# Patient Record
Sex: Male | Born: 1965
Health system: Southern US, Community
[De-identification: ages and names within clinical notes are randomized; demographics above are authoritative.]

## PROBLEM LIST (undated history)

## (undated) DIAGNOSIS — I10 Essential (primary) hypertension: Secondary | ICD-10-CM

---

## 1998-01-18 ENCOUNTER — Emergency Department (HOSPITAL_COMMUNITY): Admission: EM | Admit: 1998-01-18 | Discharge: 1998-01-18 | Payer: Self-pay | Admitting: Emergency Medicine

## 2002-11-07 ENCOUNTER — Emergency Department (HOSPITAL_COMMUNITY): Admission: EM | Admit: 2002-11-07 | Discharge: 2002-11-07 | Payer: Self-pay | Admitting: Emergency Medicine

## 2002-11-07 ENCOUNTER — Encounter: Payer: Self-pay | Admitting: Emergency Medicine

## 2003-08-30 ENCOUNTER — Emergency Department (HOSPITAL_COMMUNITY): Admission: EM | Admit: 2003-08-30 | Discharge: 2003-08-31 | Payer: Self-pay | Admitting: Emergency Medicine

## 2006-04-23 ENCOUNTER — Emergency Department (HOSPITAL_COMMUNITY): Admission: EM | Admit: 2006-04-23 | Discharge: 2006-04-23 | Payer: Self-pay | Admitting: Emergency Medicine

## 2006-10-15 ENCOUNTER — Emergency Department (HOSPITAL_COMMUNITY): Admission: EM | Admit: 2006-10-15 | Discharge: 2006-10-15 | Payer: Self-pay | Admitting: Family Medicine

## 2007-12-02 ENCOUNTER — Emergency Department (HOSPITAL_COMMUNITY): Admission: EM | Admit: 2007-12-02 | Discharge: 2007-12-02 | Payer: Self-pay | Admitting: Emergency Medicine

## 2008-10-27 ENCOUNTER — Emergency Department (HOSPITAL_COMMUNITY): Admission: EM | Admit: 2008-10-27 | Discharge: 2008-10-27 | Payer: Self-pay | Admitting: Family Medicine

## 2008-10-30 ENCOUNTER — Emergency Department (HOSPITAL_COMMUNITY): Admission: EM | Admit: 2008-10-30 | Discharge: 2008-10-30 | Payer: Self-pay | Admitting: Family Medicine

## 2008-11-06 ENCOUNTER — Emergency Department (HOSPITAL_COMMUNITY): Admission: EM | Admit: 2008-11-06 | Discharge: 2008-11-06 | Payer: Self-pay | Admitting: Emergency Medicine

## 2010-10-22 ENCOUNTER — Inpatient Hospital Stay (INDEPENDENT_AMBULATORY_CARE_PROVIDER_SITE_OTHER)
Admission: RE | Admit: 2010-10-22 | Discharge: 2010-10-22 | Disposition: A | Discharge status: Home / Self Care | Payer: Self-pay | Source: Ambulatory Visit | Attending: Family Medicine | Admitting: Family Medicine

## 2010-10-22 ENCOUNTER — Ambulatory Visit (INDEPENDENT_AMBULATORY_CARE_PROVIDER_SITE_OTHER): Payer: Self-pay

## 2010-10-22 DIAGNOSIS — M62838 Other muscle spasm: Secondary | ICD-10-CM

## 2010-10-22 DIAGNOSIS — M545 Low back pain: Secondary | ICD-10-CM

## 2010-10-28 ENCOUNTER — Inpatient Hospital Stay (INDEPENDENT_AMBULATORY_CARE_PROVIDER_SITE_OTHER)
Admission: RE | Admit: 2010-10-28 | Discharge: 2010-10-28 | Disposition: A | Discharge status: Home / Self Care | Payer: Self-pay | Source: Ambulatory Visit | Attending: Family Medicine | Admitting: Family Medicine

## 2010-10-28 DIAGNOSIS — S335XXA Sprain of ligaments of lumbar spine, initial encounter: Secondary | ICD-10-CM

## 2010-10-28 DIAGNOSIS — L02419 Cutaneous abscess of limb, unspecified: Secondary | ICD-10-CM

## 2010-10-28 DIAGNOSIS — L03119 Cellulitis of unspecified part of limb: Secondary | ICD-10-CM

## 2010-12-12 LAB — POCT URINALYSIS DIP (DEVICE)
Bilirubin Urine: NEGATIVE
Glucose, UA: NEGATIVE mg/dL
Hgb urine dipstick: NEGATIVE
Ketones, ur: NEGATIVE mg/dL
Nitrite: NEGATIVE
Protein, ur: 30 mg/dL — AB
Specific Gravity, Urine: 1.02 (ref 1.005–1.030)
Urobilinogen, UA: 0.2 mg/dL (ref 0.0–1.0)
pH: 7 (ref 5.0–8.0)

## 2010-12-17 LAB — POCT RAPID STREP A (OFFICE): Streptococcus, Group A Screen (Direct): NEGATIVE

## 2011-05-27 LAB — POCT URINALYSIS DIP (DEVICE)
Bilirubin Urine: NEGATIVE
Glucose, UA: NEGATIVE
Hgb urine dipstick: NEGATIVE
Ketones, ur: NEGATIVE
Nitrite: NEGATIVE
Operator id: 239701
Protein, ur: NEGATIVE
Specific Gravity, Urine: 1.025
Urobilinogen, UA: 1
pH: 5.5

## 2012-04-02 ENCOUNTER — Encounter (HOSPITAL_COMMUNITY): Payer: Self-pay

## 2012-04-02 ENCOUNTER — Emergency Department (HOSPITAL_COMMUNITY)
Admission: EM | Admit: 2012-04-02 | Discharge: 2012-04-03 | Disposition: A | Discharge status: Home Health | Payer: Self-pay | Attending: Emergency Medicine | Admitting: Emergency Medicine

## 2012-04-02 DIAGNOSIS — X500XXA Overexertion from strenuous movement or load, initial encounter: Secondary | ICD-10-CM | POA: Insufficient documentation

## 2012-04-02 DIAGNOSIS — S29011A Strain of muscle and tendon of front wall of thorax, initial encounter: Secondary | ICD-10-CM

## 2012-04-02 DIAGNOSIS — S23421A Sprain of chondrosternal joint, initial encounter: Secondary | ICD-10-CM | POA: Insufficient documentation

## 2012-04-02 NOTE — ED Notes (Signed)
Pt presents with no acute distress- chest pain central location non-radiating.  Pian is constant.  Increased pain with movement and breathing

## 2012-04-03 ENCOUNTER — Emergency Department (HOSPITAL_COMMUNITY): Payer: Self-pay

## 2012-04-03 LAB — BASIC METABOLIC PANEL
BUN: 19 mg/dL (ref 6–23)
CO2: 28 mEq/L (ref 19–32)
Calcium: 8.8 mg/dL (ref 8.4–10.5)
Chloride: 103 mEq/L (ref 96–112)
Creatinine, Ser: 0.99 mg/dL (ref 0.50–1.35)
GFR calc Af Amer: >90 mL/min (ref >90)
GFR calc non Af Amer: >90 mL/min (ref >90)
Glucose, Bld: 84 mg/dL (ref 70–99)
Potassium: 3.8 mEq/L (ref 3.5–5.1)
Sodium: 138 mEq/L (ref 135–145)

## 2012-04-03 LAB — CBC WITH DIFFERENTIAL/PLATELET
Basophils Absolute: 0 10*3/uL (ref 0.0–0.1)
Basophils Relative: 0 % (ref 0–1)
Eosinophils Absolute: 0.1 10*3/uL (ref 0.0–0.7)
Eosinophils Relative: 2 % (ref 0–5)
HCT: 37.8 % — ABNORMAL LOW (ref 39.0–52.0)
Hemoglobin: 12.7 g/dL — ABNORMAL LOW (ref 13.0–17.0)
Lymphocytes Relative: 31 % (ref 12–46)
Lymphs Abs: 1.9 10*3/uL (ref 0.7–4.0)
MCH: 30.6 pg (ref 26.0–34.0)
MCHC: 33.6 g/dL (ref 30.0–36.0)
MCV: 91.1 fL (ref 78.0–100.0)
Monocytes Absolute: 0.7 10*3/uL (ref 0.1–1.0)
Monocytes Relative: 12 % (ref 3–12)
Neutro Abs: 3.3 10*3/uL (ref 1.7–7.7)
Neutrophils Relative %: 55 % (ref 43–77)
Platelets: 221 10*3/uL (ref 150–400)
RBC: 4.15 MIL/uL — ABNORMAL LOW (ref 4.22–5.81)
RDW: 12.9 % (ref 11.5–15.5)
WBC: 6 10*3/uL (ref 4.0–10.5)

## 2012-04-03 MED ORDER — KETOROLAC TROMETHAMINE 30 MG/ML IJ SOLN
30.0000 mg | Freq: Once | INTRAMUSCULAR | Status: AC
  Administered 2012-04-03: 30 mg via INTRAVENOUS
  Filled 2012-04-03: qty 1

## 2012-04-03 MED ORDER — NAPROXEN 500 MG PO TABS: 500.0000 mg | ORAL_TABLET | Freq: Two times a day (BID) | ORAL | Status: DC

## 2012-04-03 MED ORDER — HYDROCODONE-ACETAMINOPHEN 5-325 MG PO TABS: 1.0000 | ORAL_TABLET | ORAL | Status: AC | PRN

## 2012-04-03 NOTE — Discharge Instructions (Signed)
Muscle Strain A muscle strain, or pulled muscle, occurs when a muscle is over-stretched. A small number of muscle fibers may also be torn. This is especially common in athletes. This happens when a sudden violent force placed on a muscle pushes it past its capacity. Usually, recovery from a pulled muscle takes 1 to 2 weeks. But complete healing will take 5 to 6 weeks. There are millions of muscle fibers. Following injury, your body will usually return to normal quickly. HOME CARE INSTRUCTIONS   While awake, apply ice to the sore muscle for 15 to 20 minutes each hour for the first 2 days. Put ice in a plastic bag and place a towel between the bag of ice and your skin.   Do not use the pulled muscle for several days. Do not use the muscle if you have pain.   You may wrap the injured area with an elastic bandage for comfort. Be careful not to bind it too tightly. This may interfere with blood circulation.   Only take over-the-counter or prescription medicines for pain, discomfort, or fever as directed by your caregiver. Do not use aspirin as this will increase bleeding (bruising) at injury site.   Warming up before exercise helps prevent muscle strains.  SEEK MEDICAL CARE IF:  There is increased pain or swelling in the affected area. MAKE SURE YOU:   Understand these instructions.   Will watch your condition.   Will get help right away if you are not doing well or get worse.  Document Released: 08/18/2005 Document Revised: 08/07/2011 Document Reviewed: 03/17/2007 [facility] Patient Information 2012 [place], [place].  Naproxen and naproxen sodium oral immediate-release tablets What is this medicine? NAPROXEN (na PROX en) is a non-steroidal anti-inflammatory drug (NSAID). It is used to reduce swelling and to treat pain. This medicine may be used for dental pain, headache, or painful monthly periods. It is also used for painful joint and muscular problems such as arthritis, tendinitis, bursitis,  and gout. This medicine may be used for other purposes; ask your health care provider or pharmacist if you have questions. What should I tell my health care provider before I take this medicine? They need to know if you have any of these conditions: -asthma -cigarette smoker -drink more than 3 alcohol containing drinks a day -heart disease or circulation problems such as heart failure or leg edema (fluid retention) -high blood pressure -kidney disease -liver disease -stomach bleeding or ulcers -an unusual or allergic reaction to naproxen, aspirin, other NSAIDs, other medicines, foods, dyes, or preservatives -pregnant or trying to get pregnant -breast-feeding How should I use this medicine? Take this medicine by mouth with a glass of water. Follow the directions on the prescription label. Take it with food if your stomach gets upset. Try to not lie down for at least 10 minutes after you take it. Take your medicine at regular intervals. Do not take your medicine more often than directed. Long-term, continuous use may increase the risk of heart attack or stroke. A special MedGuide will be given to you by the pharmacist with each prescription and refill. Be sure to read this information carefully each time. Talk to your pediatrician regarding the use of this medicine in children. Special care may be needed. Overdosage: If you think you have taken too much of this medicine contact a poison control center or emergency room at once. NOTE: This medicine is only for you. Do not share this medicine with others. What if I miss a dose? If  you miss a dose, take it as soon as you can. If it is almost time for your next dose, take only that dose. Do not take double or extra doses. What may interact with this medicine? -alcohol -aspirin -cidofovir -diuretics -lithium -methotrexate -other drugs for inflammation like ketorolac or prednisone -pemetrexed -probenecid -warfarin This list may not describe  all possible interactions. Give your health care provider a list of all the medicines, herbs, non-prescription drugs, or dietary supplements you use. Also tell them if you smoke, drink alcohol, or use illegal drugs. Some items may interact with your medicine. What should I watch for while using this medicine? Tell your doctor or health care professional if your pain does not get better. Talk to your doctor before taking another medicine for pain. Do not treat yourself. This medicine does not prevent heart attack or stroke. In fact, this medicine may increase the chance of a heart attack or stroke. The chance may increase with longer use of this medicine and in people who have heart disease. If you take aspirin to prevent heart attack or stroke, talk with your doctor or health care professional. Do not take other medicines that contain aspirin, ibuprofen, or naproxen with this medicine. Side effects such as stomach upset, nausea, or ulcers may be more likely to occur. Many medicines available without a prescription should not be taken with this medicine. This medicine can cause ulcers and bleeding in the stomach and intestines at any time during treatment. Do not smoke cigarettes or drink alcohol. These increase irritation to your stomach and can make it more susceptible to damage from this medicine. Ulcers and bleeding can happen without warning symptoms and can cause death. You may get drowsy or dizzy. Do not drive, use machinery, or do anything that needs mental alertness until you know how this medicine affects you. Do not stand or sit up quickly, especially if you are an older patient. This reduces the risk of dizzy or fainting spells. This medicine can cause you to bleed more easily. Try to avoid damage to your teeth and gums when you brush or floss your teeth. What side effects may I notice from receiving this medicine? Side effects that you should report to your doctor or health care professional as  soon as possible: -black or bloody stools, blood in the urine or vomit -blurred vision -chest pain -difficulty breathing or wheezing -nausea or vomiting -severe stomach pain -skin rash, skin redness, blistering or peeling skin, hives, or itching -slurred speech or weakness on one side of the body -swelling of eyelids, throat, lips -unexplained weight gain or swelling -unusually weak or tired -yellowing of eyes or skin Side effects that usually do not require medical attention (report to your doctor or health care professional if they continue or are bothersome): -constipation -headache -heartburn This list may not describe all possible side effects. Call your doctor for medical advice about side effects. You may report side effects to FDA at 1-800-FDA-1088. Where should I keep my medicine? Keep out of the reach of children. Store at room temperature between 15 and 30 degrees C (59 and 86 degrees F). Keep container tightly closed. Throw away any unused medicine after the expiration date. NOTE: This sheet is a summary. It may not cover all possible information. If you have questions about this medicine, talk to your doctor, pharmacist, or health care provider.  2012, [employer] Standard. (08/20/2009 8:10:16 PM)  Acetaminophen; Hydrocodone tablets or capsules What is this medicine? ACETAMINOPHEN; HYDROCODONE (a  set a MEE noe fen; hye droe KOE done) is a pain reliever. It is used to treat mild to moderate pain. This medicine may be used for other purposes; ask your health care provider or pharmacist if you have questions. What should I tell my health care provider before I take this medicine? They need to know if you have any of these conditions: -brain tumor -Crohn's disease, inflammatory bowel disease, or ulcerative colitis -drink more than 3 alcohol-containing drinks per day -drug abuse or addiction -head injury -heart or circulation problems -kidney disease or problems going  to the bathroom -liver disease -lung disease, asthma, or breathing problems -an unusual or allergic reaction to acetaminophen, hydrocodone, other opioid analgesics, other medicines, foods, dyes, or preservatives -pregnant or trying to get pregnant -breast-feeding How should I use this medicine? Take this medicine by mouth. Swallow it with a full glass of water. Follow the directions on the prescription label. If the medicine upsets your stomach, take the medicine with food or milk. Do not take more than you are told to take. Talk to your pediatrician regarding the use of this medicine in children. This medicine is not approved for use in children. Overdosage: If you think you have taken too much of this medicine contact a poison control center or emergency room at once. NOTE: This medicine is only for you. Do not share this medicine with others. What if I miss a dose? If you miss a dose, take it as soon as you can. If it is almost time for your next dose, take only that dose. Do not take double or extra doses. What may interact with this medicine? -alcohol or medicines that contain alcohol -antihistamines -isoniazid -medicines for depression, anxiety, or psychotic disturbances -medicines for pain including pentazocine, buprenorphine, butorphanol, nalbuphine, tramadol, and propoxyphene -medicines for sleep -muscle relaxants -naltrexone -phenobarbital -ritonavir This list may not describe all possible interactions. Give your health care provider a list of all the medicines, herbs, non-prescription drugs, or dietary supplements you use. Also tell them if you smoke, drink alcohol, or use illegal drugs. Some items may interact with your medicine. What should I watch for while using this medicine? Tell your doctor or health care professional if your pain does not go away, if it gets worse, or if you have new or a different type of pain. You may develop tolerance to the medicine. Tolerance means  that you will need a higher dose of the medicine for pain relief. Tolerance is normal and is expected if you take the medicine for a long time. Do not suddenly stop taking your medicine because you may develop a severe reaction. Your body becomes used to the medicine. This does NOT mean you are addicted. Addiction is a behavior related to getting and using a drug for a non-medical reason. If you have pain, you have a medical reason to take pain medicine. Your doctor will tell you how much medicine to take. If your doctor wants you to stop the medicine, the dose will be slowly lowered over time to avoid any side effects. You may get drowsy or dizzy when you first start taking the medicine or change doses. Do not drive, use machinery, or do anything that may be dangerous until you know how the medicine affects you. Stand or sit up slowly. The medicine will cause constipation. Try to have a bowel movement at least every 2 to 3 days. If you do not have a bowel movement for 3 days, call your  doctor or health care professional. Too much acetaminophen can be very dangerous. Do not take Tylenol (acetaminophen) or medicines that contain acetaminophen with this medicine. Many non-prescription medicines contain acetaminophen. Always read the labels carefully. What side effects may I notice from receiving this medicine? Side effects that you should report to your doctor or health care professional as soon as possible: -allergic reactions like skin rash, itching or hives, swelling of the face, lips, or tongue -breathing problems -confusion -feeling faint or lightheaded, falls -stomach pain -yellowing of the eyes or skin Side effects that usually do not require medical attention (report to your doctor or health care professional if they continue or are bothersome): -nausea, vomiting -stomach upset This list may not describe all possible side effects. Call your doctor for medical advice about side effects. You may  report side effects to FDA at 1-800-FDA-1088. Where should I keep my medicine? Keep out of the reach of children. This medicine can be abused. Keep your medicine in a safe place to protect it from theft. Do not share this medicine with anyone. Selling or giving away this medicine is dangerous and against the law. Store at room temperature between 15 and 30 degrees C (59 and 86 degrees F). Protect from light. Keep container tightly closed. Throw away any unused medicine after the expiration date. NOTE: This sheet is a summary. It may not cover all possible information. If you have questions about this medicine, talk to your doctor, pharmacist, or health care provider.  2012, [employer] Standard. (11/09/2007 10:25:07 AM)

## 2012-04-03 NOTE — ED Provider Notes (Signed)
History     CSN: [id number]  Arrival date & time 04/02/12  2336   First MD Initiated Contact with Patient 04/03/12 0304      Chief Complaint  Patient presents with  . Chest Pain    (Consider location/radiation/quality/duration/timing/severity/associated sxs/prior treatment) Patient is a 46 y.o. male presenting with chest pain. The history is provided by the patient.  Chest Pain   He was lifting some limbs with a pitchfork yesterday when he felt some pain in his left anterior chest. Since then, he has had sharp pain in his left anterior chest. It is worse with taking deep breaths and worse with moving. It is better when he sits still. There's been associated dyspnea, nausea, diaphoresis. He has not done anything to try and treat it. Pain is moderate to severe and he rates it at 7/10. He has no cardiac risk factors-he is a nonsmoker and no history of diabetes, hypertension, hyperlipidemia, or family history of premature coronary atherosclerosis.  History reviewed. No pertinent past medical history.  History reviewed. No pertinent past surgical history.  No family history on file.  History  Substance Use Topics  . Smoking status: Never Smoker   . Smokeless tobacco: Not on file  . Alcohol Use: No      Review of Systems  Cardiovascular: Positive for chest pain.  All other systems reviewed and are negative.    Allergies  Review of patient's allergies indicates no known allergies.  Home Medications   Current Outpatient Rx  Name Route Sig Dispense Refill  . CLINDAMYCIN HCL 150 MG PO CAPS Oral Take 150 mg by mouth daily.    Marland Kitchen DOXYCYCLINE HYCLATE 100 MG PO CAPS Oral Take 100 mg by mouth 3 (three) times daily.      BP 132/89  Pulse 78  Temp 98.5 F (36.9 C) (Oral)  Resp 13  SpO2 100%  Physical Exam  Nursing note and vitals reviewed.  46 year old male who is resting comfortably and in no acute distress, although he is obviously in pain when he tries to sit up. Vital  signs are normal. Oxygen saturation is 99% which is normal. Head is normocephalic and atraumatic. PERRLA, EOMI. Oropharynx is clear. Neck is nontender and supple without adenopathy or JVD. Lungs are clear without rales, wheezes, rhonchi. Back is nontender. Heart has regular rate and rhythm without murmur. There is moderate tenderness to palpation of the left anterior chest wall which does reproduce his pain. Abdomen is soft, flat, nontender without masses or hepatosplenomegaly. Extremities have no cyanosis or edema, full range of motion is present. Skin is warm and dry without rash. Neurologic: Mental status is normal, cranial nerves are intact, there are no motor or sensory deficits.   ED Course  Procedures (including critical care time)  Results for orders placed during the hospital encounter of 04/02/12  CBC WITH DIFFERENTIAL      Component Value Range   WBC 6.0  4.0 - 10.5 K/uL   RBC 4.15 (*) 4.22 - 5.81 MIL/uL   Hemoglobin 12.7 (*) 13.0 - 17.0 g/dL   HCT 40.9 (*) 81.1 - 91.4 %   MCV 91.1  78.0 - 100.0 fL   MCH 30.6  26.0 - 34.0 pg   MCHC 33.6  30.0 - 36.0 g/dL   RDW 78.2  95.6 - 21.3 %   Platelets 221  150 - 400 K/uL   Neutrophils Relative 55  43 - 77 %   Neutro Abs 3.3  1.7 - 7.7 K/uL  Lymphocytes Relative 31  12 - 46 %   Lymphs Abs 1.9  0.7 - 4.0 K/uL   Monocytes Relative 12  3 - 12 %   Monocytes Absolute 0.7  0.1 - 1.0 K/uL   Eosinophils Relative 2  0 - 5 %   Eosinophils Absolute 0.1  0.0 - 0.7 K/uL   Basophils Relative 0  0 - 1 %   Basophils Absolute 0.0  0.0 - 0.1 K/uL  BASIC METABOLIC PANEL      Component Value Range   Sodium 138  135 - 145 mEq/L   Potassium 3.8  3.5 - 5.1 mEq/L   Chloride 103  96 - 112 mEq/L   CO2 28  19 - 32 mEq/L   Glucose, Bld 84  70 - 99 mg/dL   BUN 19  6 - 23 mg/dL   Creatinine, Ser 4.78  0.50 - 1.35 mg/dL   Calcium 8.8  8.4 - 29.5 mg/dL   GFR calc non Af Amer >90  >90 mL/min   GFR calc Af Amer >90  >90 mL/min   Dg Chest 2 View  04/03/2012   *RADIOLOGY REPORT*  Clinical Data: Chest pain  CHEST - 2 VIEW  Comparison: None.  Findings: Shallow inspiration.  Suggestion of atelectasis in the lung bases.  Normal heart size and pulmonary vascularity.  No focal airspace consolidation.  No blunting of costophrenic angles.  No pneumothorax.  Degenerative changes in the spine.  IMPRESSION: Shallow inspiration with mild atelectasis in the lung bases.  Original Report Authenticated By: Marlon Pel, M.D.     ECG shows normal sinus rhythm with a rate of 91, no ectopy. Normal axis. Normal P wave. Normal QRS. Normal intervals. Normal ST and T waves. Impression: normal ECG. No prior ECG available for comparison.   1. Chest wall muscle strain       MDM  Chest pain which seems clearly to be chest wall pain. His ECG is normal and he has no cardiac risk factors and there is a clear injury that precipitated the pain. Chest x-ray will be obtained and will be given a therapeutic trial of Toradol  He feels much better after a dose of Toradol. He is sent home with prescriptions for naproxen and Norco.      Dione Booze, MD 04/03/12 [id number]

## 2012-04-27 ENCOUNTER — Encounter (HOSPITAL_COMMUNITY): Payer: Self-pay | Admitting: *Deleted

## 2012-04-27 ENCOUNTER — Emergency Department (HOSPITAL_COMMUNITY): Payer: Self-pay

## 2012-04-27 ENCOUNTER — Emergency Department (HOSPITAL_COMMUNITY)
Admission: EM | Admit: 2012-04-27 | Discharge: 2012-04-27 | Disposition: A | Discharge status: Home / Self Care | Payer: Self-pay | Attending: Emergency Medicine | Admitting: Emergency Medicine

## 2012-04-27 DIAGNOSIS — R609 Edema, unspecified: Secondary | ICD-10-CM | POA: Insufficient documentation

## 2012-04-27 DIAGNOSIS — W11XXXA Fall on and from ladder, initial encounter: Secondary | ICD-10-CM | POA: Insufficient documentation

## 2012-04-27 DIAGNOSIS — M25572 Pain in left ankle and joints of left foot: Secondary | ICD-10-CM

## 2012-04-27 DIAGNOSIS — Z87891 Personal history of nicotine dependence: Secondary | ICD-10-CM | POA: Insufficient documentation

## 2012-04-27 DIAGNOSIS — M25473 Effusion, unspecified ankle: Secondary | ICD-10-CM

## 2012-04-27 DIAGNOSIS — M25579 Pain in unspecified ankle and joints of unspecified foot: Secondary | ICD-10-CM | POA: Insufficient documentation

## 2012-04-27 HISTORY — DX: Essential (primary) hypertension: I10

## 2012-04-27 MED ORDER — IBUPROFEN 800 MG PO TABS
800.0000 mg | ORAL_TABLET | Freq: Once | ORAL | Status: AC
  Administered 2012-04-27: 800 mg via ORAL
  Filled 2012-04-27: qty 1

## 2012-04-27 MED ORDER — MELOXICAM 15 MG PO TABS: 15.0000 mg | ORAL_TABLET | Freq: Every day | ORAL | Status: DC

## 2012-04-27 NOTE — ED Notes (Signed)
Pt cutting tree down, was up a ladder. Jumped off from 4-5 feet. C/o left ankle pain. Denies LOC

## 2012-04-27 NOTE — ED Notes (Signed)
Pt reports essentially he jumped from approx 4-5 feet off ladder & landed on left foot. Denies LOC, other injuries.

## 2012-04-27 NOTE — Progress Notes (Signed)
Orthopedic Tech Progress Note Patient Details:  Scott Yang 1966-07-08 [id number]  Ortho Devices Type of Ortho Device: ASO;Crutches Ortho Device/Splint Location: left ankle Ortho Device/Splint Interventions: Application   Lanette Ell 04/27/2012, 2:49 PM

## 2012-05-02 ENCOUNTER — Emergency Department (HOSPITAL_COMMUNITY)
Admission: EM | Admit: 2012-05-02 | Discharge: 2012-05-02 | Disposition: A | Discharge status: Home Health | Payer: Self-pay | Attending: Emergency Medicine | Admitting: Emergency Medicine

## 2012-05-02 ENCOUNTER — Encounter (HOSPITAL_COMMUNITY): Payer: Self-pay | Admitting: *Deleted

## 2012-05-02 ENCOUNTER — Emergency Department (HOSPITAL_COMMUNITY): Payer: Self-pay

## 2012-05-02 DIAGNOSIS — S93409A Sprain of unspecified ligament of unspecified ankle, initial encounter: Secondary | ICD-10-CM | POA: Insufficient documentation

## 2012-05-02 DIAGNOSIS — W11XXXA Fall on and from ladder, initial encounter: Secondary | ICD-10-CM | POA: Insufficient documentation

## 2012-05-02 DIAGNOSIS — R062 Wheezing: Secondary | ICD-10-CM | POA: Insufficient documentation

## 2012-05-02 DIAGNOSIS — M7989 Other specified soft tissue disorders: Secondary | ICD-10-CM

## 2012-05-02 DIAGNOSIS — M79609 Pain in unspecified limb: Secondary | ICD-10-CM

## 2012-05-02 MED ORDER — ALBUTEROL SULFATE HFA 108 (90 BASE) MCG/ACT IN AERS: 2.0000 | INHALATION_SPRAY | RESPIRATORY_TRACT | Status: DC | PRN

## 2012-05-02 MED ORDER — ALBUTEROL SULFATE HFA 108 (90 BASE) MCG/ACT IN AERS
INHALATION_SPRAY | RESPIRATORY_TRACT | Status: AC
  Filled 2012-05-02: qty 6.7

## 2012-05-02 NOTE — ED Notes (Signed)
Pt to xray via stretcher

## 2012-05-02 NOTE — ED Provider Notes (Addendum)
History     CSN: [id number]  Arrival date & time 05/02/12  0932   First MD Initiated Contact with Patient 05/02/12 1058      Chief Complaint  Patient presents with  . Ankle Pain  . Joint Swelling    (Consider location/radiation/quality/duration/timing/severity/associated sxs/prior treatment) Patient is a 46 y.o. male presenting with ankle pain.  Ankle Pain    Patient fell from a ladder last week injuring his left ankle. He presents today as he has increased swelling in his left ankle and left lower leg. Pain is minimal he has minimal pain at the proximal dorsal foot upon plantar flexing or dorsiflexing his left ankle. No other complaint . He reports he has not been affect quickly elevating his leg. He's been wearing the Velcro splint intermittently. No other treatment prior to coming here no chest pain no other associated symptoms History reviewed. No pertinent past medical history. Past history negative History reviewed. No pertinent past surgical history.  History reviewed. No pertinent family history.  History  Substance Use Topics  . Smoking status: Former Games developer  . Smokeless tobacco: Not on file  . Alcohol Use: No      Review of Systems  Constitutional: Negative.   HENT: Negative.   Respiratory: Positive for wheezing.   Cardiovascular: Negative.   Gastrointestinal: Negative.   Musculoskeletal: Positive for arthralgias.       The left lower leg pain and swelling  Skin: Negative.   Neurological: Negative.   Hematological: Negative.   Psychiatric/Behavioral: Negative.     Allergies  Review of patient's allergies indicates no known allergies.  Home Medications   Current Outpatient Rx  Name Route Sig Dispense Refill  . IBUPROFEN 200 MG PO TABS Oral Take 600 mg by mouth every 6 (six) hours as needed. For pain    . PRESCRIPTION MEDICATION Injection Inject as directed every 14 (fourteen) days. Testosterone Inj    . MELOXICAM 15 MG PO TABS Oral Take 15 mg by mouth  daily.      BP 158/102  Pulse 79  Temp 97.9 F (36.6 C) (Oral)  Resp 18  SpO2 96%  Physical Exam  Nursing note and vitals reviewed. Constitutional: He appears well-developed and well-nourished.  HENT:  Head: Normocephalic and atraumatic.  Eyes: Conjunctivae are normal. Pupils are equal, round, and reactive to light.  Neck: Neck supple. No tracheal deviation present. No thyromegaly present.  Cardiovascular: Normal rate and regular rhythm.   No murmur heard. Pulmonary/Chest: Effort normal and breath sounds normal.  Abdominal: Soft. Bowel sounds are normal. He exhibits no distension. There is no tenderness.  Musculoskeletal: He exhibits edema. He exhibits no tenderness.       Left lower extremity swollen past lower leg, distal two thirds. Dorsum of the foot is swollen. Ecchymotic lateral malleolus. DP pulse 2+, no point tenderness, no cords  Neurological: He is alert. Coordination normal.  Skin: Skin is warm and dry. No rash noted.  Psychiatric: He has a normal mood and affect.    ED Course  Procedures (including critical care time)  Labs Reviewed - No data to display No results found.   No diagnosis found.  12:30 PM reports to me that "I've been wheezing on occasion lately" particularly when he is outside and around grass. Denies shortness of breath presently. Chest x-ray ordered. 2:05 PM patient resting comfortably.  no distress,, breathing normal. Chest x-ray reviewed by me and discussed with radiologist Doppler study of left leg negative for DVT. Results for orders  placed during the hospital encounter of 04/02/12  CBC WITH DIFFERENTIAL      Component Value Range   WBC 6.0  4.0 - 10.5 K/uL   RBC 4.15 (*) 4.22 - 5.81 MIL/uL   Hemoglobin 12.7 (*) 13.0 - 17.0 g/dL   HCT 40.9 (*) 81.1 - 91.4 %   MCV 91.1  78.0 - 100.0 fL   MCH 30.6  26.0 - 34.0 pg   MCHC 33.6  30.0 - 36.0 g/dL   RDW 78.2  95.6 - 21.3 %   Platelets 221  150 - 400 K/uL   Neutrophils Relative 55  43 - 77  %   Neutro Abs 3.3  1.7 - 7.7 K/uL   Lymphocytes Relative 31  12 - 46 %   Lymphs Abs 1.9  0.7 - 4.0 K/uL   Monocytes Relative 12  3 - 12 %   Monocytes Absolute 0.7  0.1 - 1.0 K/uL   Eosinophils Relative 2  0 - 5 %   Eosinophils Absolute 0.1  0.0 - 0.7 K/uL   Basophils Relative 0  0 - 1 %   Basophils Absolute 0.0  0.0 - 0.1 K/uL  BASIC METABOLIC PANEL      Component Value Range   Sodium 138  135 - 145 mEq/L   Potassium 3.8  3.5 - 5.1 mEq/L   Chloride 103  96 - 112 mEq/L   CO2 28  19 - 32 mEq/L   Glucose, Bld 84  70 - 99 mg/dL   BUN 19  6 - 23 mg/dL   Creatinine, Ser 0.86  0.50 - 1.35 mg/dL   Calcium 8.8  8.4 - 57.8 mg/dL   GFR calc non Af Amer >90  >90 mL/min   GFR calc Af Amer >90  >90 mL/min   Dg Chest 2 View  05/02/2012  *RADIOLOGY REPORT*  Clinical Data: Wheezing  CHEST - 2 VIEW  Comparison: 04/03/2012  Findings: Mild cardiomegaly stable.  Lungs clear.  No effusion. Spurring in the lower thoracic spine.  IMPRESSION:  1.  Stable mild cardiomegaly.   Original Report Authenticated By: Osa Craver, M.D.    Dg Chest 2 View  04/03/2012  *RADIOLOGY REPORT*  Clinical Data: Chest pain  CHEST - 2 VIEW  Comparison: None.  Findings: Shallow inspiration.  Suggestion of atelectasis in the lung bases.  Normal heart size and pulmonary vascularity.  No focal airspace consolidation.  No blunting of costophrenic angles.  No pneumothorax.  Degenerative changes in the spine.  IMPRESSION: Shallow inspiration with mild atelectasis in the lung bases.  Original Report Authenticated By: Marlon Pel, M.D.   Dg Ankle Complete Left  04/27/2012  *RADIOLOGY REPORT*  Clinical Data: Fall, ankle pain  LEFT ANKLE COMPLETE - 3+ VIEW  Comparison: None.  Findings: No acute fracture or dislocation is seen.  The ankle mortise is intact.  The base of the fifth metatarsal is unremarkable.  Prior trauma involving the distal tibiofibular syndesmosis.  Moderate generalized soft tissue swelling.  IMPRESSION: No  acute fracture or dislocation is seen.  Moderate generalized soft tissue swelling.   Original Report Authenticated By: Charline Bills, M.D.     MDM  Plan patient given oral degrees per orthopedics as last visit here with whom he will followup. Albuterol HFA to go with spacer to use 2 puffs every 4 hours as needed for wheeze. Blood pressure recheck 3 weeks Diagnosis #1 left ankle sprain #2 elevated blood pressure #3 dyspnea  Doug Sou, MD 05/02/12 1411  Doug Sou, MD 05/02/12 [phone]

## 2012-05-02 NOTE — ED Notes (Signed)
Ultrasound at bedside

## 2012-05-02 NOTE — Progress Notes (Signed)
VASCULAR LAB PRELIMINARY  PRELIMINARY  PRELIMINARY  PRELIMINARY  Left lower extremity venous Doppler completed.    Preliminary report:  There is no DVT or SVT noted in the left lower extremity.  Scott Yang, 05/02/2012, 11:59 AM

## 2012-05-02 NOTE — ED Notes (Addendum)
Pt fell from ladder on the 27th hurting left ankle, xray was negative for break or fracture. Pt reports he is still having swelling to left ankle. Pt has crutches and ankle is in wrap. Pt reports cont pain to ankle. Pt has applying minimal pressure and not elevating. Pt has swelling going up to mid calf. Only reports pain to ankle however.

## 2012-05-03 NOTE — ED Provider Notes (Signed)
History     CSN: [id number]  Arrival date & time 04/27/12  1153   First MD Initiated Contact with Patient 04/27/12 1230      Chief Complaint  Patient presents with  . Fall  . Ankle Pain    (Consider location/radiation/quality/duration/timing/severity/associated sxs/prior treatment) HPI Comments: Scott Yang 46 y.o. male   The chief complaint is: Patient presents with:   Fall   Ankle Pain    Patient states he fell from ladder while cutting limbs.  States that the ladder sliped and he rod it down from a height of about 20 feet and jumped off with approx 5 feet to go.  He landed on left foot, flat footed .  Felt immediate pain in ankle. abjke is swelling  Unable to ambulate due to pain. Denies rolling ankle. Denies, numbnessor tingling. Denies inablity to move toes or ankle joint. Denies pain in Back or pelvis.  Denies hitting head or LOC.    Patient is a 46 y.o. male presenting with fall and ankle pain. The history is provided by the patient. No language interpreter was used.  Fall The accident occurred 1 to 2 hours ago. The fall occurred from a ladder. He fell from a height of 6 to 10 ft. He landed on grass. There was no blood loss. Point of impact: left ankle. Pain location: left ankle. The pain is at a severity of 8/10. The pain is moderate. He was not ambulatory at the scene. There was no entrapment after the fall. There was no alcohol use involved in the accident. Pertinent negatives include no visual change, no fever, no numbness, no abdominal pain, no bowel incontinence, no nausea, no vomiting, no hematuria, no headaches, no hearing loss, no loss of consciousness and no tingling. The symptoms are aggravated by activity, use of the injured limb, standing, ambulation, flexion, extension and rotation. He has tried nothing for the symptoms.  Ankle Pain  Pertinent negatives include no numbness and no tingling.    History reviewed. No pertinent past medical  history.  History reviewed. No pertinent past surgical history.  No family history on file.  History  Substance Use Topics  . Smoking status: Former Games developer  . Smokeless tobacco: Not on file  . Alcohol Use: No      Review of Systems  Constitutional: Negative for fever.  Gastrointestinal: Negative for nausea, vomiting, abdominal pain and bowel incontinence.  Genitourinary: Negative for hematuria.  Neurological: Negative for tingling, loss of consciousness, numbness and headaches.    Allergies  Review of patient's allergies indicates no known allergies.  Home Medications   Current Outpatient Rx  Name Route Sig Dispense Refill  . PRESCRIPTION MEDICATION Injection Inject as directed every 14 (fourteen) days. Testosterone Inj    . IBUPROFEN 200 MG PO TABS Oral Take 600 mg by mouth every 6 (six) hours as needed. For pain    . MELOXICAM 15 MG PO TABS Oral Take 15 mg by mouth daily.      BP 146/82  Pulse 91  Temp 98.5 F (36.9 C) (Oral)  Resp 20  SpO2 96%  Physical Exam  Nursing note and vitals reviewed. Constitutional: He appears well-developed and well-nourished. No distress.  HENT:  Head: Normocephalic and atraumatic.  Eyes: Conjunctivae are normal. No scleral icterus.  Neck: Normal range of motion. Neck supple.  Cardiovascular: Normal rate, regular rhythm and normal heart sounds.   Pulmonary/Chest: Effort normal and breath sounds normal. No respiratory distress.  Abdominal: Soft. There is no  tenderness.  Musculoskeletal:       Left ankle: He exhibits decreased range of motion and swelling. He exhibits no ecchymosis, no deformity, no laceration and normal pulse. tenderness. Lateral malleolus and medial malleolus tenderness found. No AITFL, no CF ligament, no posterior TFL, no head of 5th metatarsal and no proximal fibula tenderness found. Achilles tendon normal.       Feet:       ttp of calcaneus. No ttp of pelvis or LB.  Neurological: He is alert.  Skin: Skin is  warm and dry. He is not diaphoretic.  Psychiatric: His behavior is normal.    ED Course  Procedures (including critical care time)  Labs Reviewed - No data to display Dg Chest 2 View  05/02/2012  *RADIOLOGY REPORT*  Clinical Data: Wheezing  CHEST - 2 VIEW  Comparison: 04/03/2012  Findings: Mild cardiomegaly stable.  Lungs clear.  No effusion. Spurring in the lower thoracic spine.  IMPRESSION:  1.  Stable mild cardiomegaly.   Original Report Authenticated By: Osa Craver, M.D.    Patient c/o sig. Pain . i am providing him with ibuprofen and ice pack.. R/o sprain vs. Break.     Plain films reveal no acute fractures or dislocations.  I am providing patine with ASO ankle wrap and crutches.  I will give patient meloxicam on d/c and I am also providing info on supportive care.  F/u with ortho. Discussed reasons to seek immediate care. Patient expresses understanding and agrees with plan.   1. Ankle pain, left   2. Ankle edema       MDM  Patient safe for d/c       Arthor Captain, PA-C 05/03/12 1343

## 2012-05-03 NOTE — ED Provider Notes (Signed)
Medical screening examination/treatment/procedure(s) were conducted as a shared visit with non-physician practitioner(s) and myself.  I personally evaluated the patient during the encounter  Cheri Guppy, MD 05/03/12 2312

## 2012-09-03 ENCOUNTER — Emergency Department (HOSPITAL_COMMUNITY)
Admission: EM | Admit: 2012-09-03 | Discharge: 2012-09-03 | Disposition: A | Discharge status: Home / Self Care | Payer: Self-pay | Source: Home / Self Care

## 2012-09-03 ENCOUNTER — Encounter (HOSPITAL_COMMUNITY): Payer: Self-pay | Admitting: *Deleted

## 2012-09-03 DIAGNOSIS — J069 Acute upper respiratory infection, unspecified: Secondary | ICD-10-CM

## 2012-09-03 NOTE — ED Notes (Signed)
Pt   Reports  Symptoms  Of  Cough   Congestion  /  Body  Aches   X  2  Days      Pt       Reports  Chills  As  Well   Pt  Said the  Symptoms  Started  Out  sev  Days  Ago   -  He  Is  Masked  And  Is  In a  Private  Room    He  Is  Sitting  Upright on  The  Exam table        Speaking in  Complete  sentances

## 2012-09-03 NOTE — ED Provider Notes (Signed)
History     CSN: [id number]  Arrival date & time 09/03/12  1012   None     Chief Complaint  Patient presents with  . Generalized Body Aches    (Consider location/radiation/quality/duration/timing/severity/associated sxs/prior treatment) HPI Comments: 47 year old male presents with a two-day history of minor sore throat associated with upper respiratory congestion, nasal congestion, sniffles, night sweats and feeling cold and hot and myalgias. He denies documented fever.   History reviewed. No pertinent past medical history.  History reviewed. No pertinent past surgical history.  No family history on file.  History  Substance Use Topics  . Smoking status: Former Games developer  . Smokeless tobacco: Not on file  . Alcohol Use: No      Review of Systems  Constitutional: Positive for chills. Negative for fever and appetite change.  HENT:       As per history of present illness  Eyes: Negative.   Respiratory: Positive for cough.        Cough started this morning.  Cardiovascular: Positive for chest pain.  Gastrointestinal: Negative.   Genitourinary: Negative.   Musculoskeletal: Positive for myalgias.  Neurological: Negative.     Allergies  Review of patient's allergies indicates no known allergies.  Home Medications   Current Outpatient Rx  Name  Route  Sig  Dispense  Refill  . IBUPROFEN 200 MG PO TABS   Oral   Take 600 mg by mouth every 6 (six) hours as needed. For pain         . MELOXICAM 15 MG PO TABS   Oral   Take 15 mg by mouth daily.         Marland Kitchen PRESCRIPTION MEDICATION   Injection   Inject as directed every 14 (fourteen) days. Testosterone Inj           BP 130/91  Pulse 76  Temp 98.1 F (36.7 C) (Oral)  Resp 18  SpO2 97%  Physical Exam  Nursing note and vitals reviewed. Constitutional: He is oriented to person, place, and time. He appears well-developed and well-nourished. No distress.  HENT:       Bilateral TMs are normal Oropharynx with  minor erythema and clear PND, no exudates  Neck: Normal range of motion. Neck supple.  Cardiovascular: Normal rate, regular rhythm and normal heart sounds.   Pulmonary/Chest: Effort normal and breath sounds normal. No respiratory distress. He has no wheezes. He has no rales.  Musculoskeletal: Normal range of motion. He exhibits no edema.  Lymphadenopathy:    He has no cervical adenopathy.  Neurological: He is alert and oriented to person, place, and time.  Skin: Skin is warm and dry. No rash noted.  Psychiatric: He has a normal mood and affect.    ED Course  Procedures (including critical care time)  Labs Reviewed - No data to display No results found.   1. URI (upper respiratory infection)       MDM  Stay home and rest. Lots of fluids and stay well-hydrated A good combination of medicines as NyQuil and plain Robitussin. For any worsening or new symptoms may return otherwise to followup with your PCP as needed. She is discharged in stable condition with normal respirations, afebrile and minor URI symptoms.         Hayden Rasmussen, NP 09/03/12 1116

## 2012-09-07 NOTE — ED Provider Notes (Signed)
Medical screening examination/treatment/procedure(s) were performed by resident physician or non-physician practitioner and as supervising physician I was immediately available for consultation/collaboration.   Barkley Bruns MD.    Linna Hoff, MD 09/07/12 [phone]

## 2012-12-28 ENCOUNTER — Emergency Department (HOSPITAL_COMMUNITY)
Admission: EM | Admit: 2012-12-28 | Discharge: 2012-12-28 | Disposition: A | Discharge status: Home / Self Care | Payer: Self-pay | Attending: Emergency Medicine | Admitting: Emergency Medicine

## 2012-12-28 ENCOUNTER — Encounter (HOSPITAL_COMMUNITY): Payer: Self-pay | Admitting: Emergency Medicine

## 2012-12-28 ENCOUNTER — Emergency Department (HOSPITAL_COMMUNITY): Payer: Self-pay

## 2012-12-28 DIAGNOSIS — H539 Unspecified visual disturbance: Secondary | ICD-10-CM | POA: Insufficient documentation

## 2012-12-28 DIAGNOSIS — R42 Dizziness and giddiness: Secondary | ICD-10-CM | POA: Insufficient documentation

## 2012-12-28 DIAGNOSIS — Z87891 Personal history of nicotine dependence: Secondary | ICD-10-CM | POA: Insufficient documentation

## 2012-12-28 DIAGNOSIS — R630 Anorexia: Secondary | ICD-10-CM | POA: Insufficient documentation

## 2012-12-28 DIAGNOSIS — R3915 Urgency of urination: Secondary | ICD-10-CM | POA: Insufficient documentation

## 2012-12-28 DIAGNOSIS — R112 Nausea with vomiting, unspecified: Secondary | ICD-10-CM | POA: Insufficient documentation

## 2012-12-28 DIAGNOSIS — R350 Frequency of micturition: Secondary | ICD-10-CM | POA: Insufficient documentation

## 2012-12-28 LAB — COMPREHENSIVE METABOLIC PANEL
BUN: 13 mg/dL (ref 6–23)
CO2: 26 mEq/L (ref 19–32)
Chloride: 104 mEq/L (ref 96–112)
Creatinine, Ser: 0.73 mg/dL (ref 0.50–1.35)
GFR calc non Af Amer: >90 mL/min (ref >90)
Glucose, Bld: 107 mg/dL — ABNORMAL HIGH (ref 70–99)
Total Bilirubin: 0.3 mg/dL (ref 0.3–1.2)

## 2012-12-28 LAB — URINALYSIS, ROUTINE W REFLEX MICROSCOPIC
Bilirubin Urine: NEGATIVE
Hgb urine dipstick: NEGATIVE
Protein, ur: NEGATIVE mg/dL
Urobilinogen, UA: 0.2 mg/dL (ref 0.0–1.0)

## 2012-12-28 LAB — CBC WITH DIFFERENTIAL/PLATELET
Basophils Relative: 1 % (ref 0–1)
HCT: 38.2 % — ABNORMAL LOW (ref 39.0–52.0)
Hemoglobin: 13.6 g/dL (ref 13.0–17.0)
Lymphocytes Relative: 40 % (ref 12–46)
Lymphs Abs: 1.3 10*3/uL (ref 0.7–4.0)
MCHC: 35.6 g/dL (ref 30.0–36.0)
Monocytes Absolute: 0.3 10*3/uL (ref 0.1–1.0)
Monocytes Relative: 10 % (ref 3–12)
Neutro Abs: 1.4 10*3/uL — ABNORMAL LOW (ref 1.7–7.7)

## 2012-12-28 LAB — GLUCOSE, CAPILLARY: Glucose-Capillary: 101 mg/dL — ABNORMAL HIGH (ref 70–99)

## 2012-12-28 LAB — POCT I-STAT TROPONIN I: Troponin i, poc: 0 ng/mL (ref 0.00–0.08)

## 2012-12-28 MED ORDER — MECLIZINE HCL 50 MG PO TABS: 25.0000 mg | ORAL_TABLET | Freq: Three times a day (TID) | ORAL | Status: DC | PRN

## 2012-12-28 MED ORDER — ONDANSETRON 4 MG PO TBDP
4.0000 mg | ORAL_TABLET | Freq: Once | ORAL | Status: AC
  Administered 2012-12-28: 4 mg via ORAL
  Filled 2012-12-28: qty 1

## 2012-12-28 MED ORDER — MECLIZINE HCL 25 MG PO TABS
25.0000 mg | ORAL_TABLET | Freq: Once | ORAL | Status: AC
  Administered 2012-12-28: 25 mg via ORAL
  Filled 2012-12-28: qty 1

## 2012-12-28 NOTE — ED Notes (Signed)
Pt here with dizziness upon waking this am; pt sts feels like room spinning; pt sts N/V; pt vomiting at present and is diaphoretic

## 2012-12-28 NOTE — ED Provider Notes (Signed)
Complains of intermittent feeling of off balance onset 2 days ago. Worse with moving his head symptoms accompanied by blurred vision and nausea lasts a few seconds improved with remaining still. Presently asymptomatic on exam alert Glasgow Coma Score 15 cranial nerves II through XII intact gait normal Romberg normal pronator drift normal  Doug Sou, MD 12/28/12 1422

## 2012-12-28 NOTE — ED Provider Notes (Signed)
Medical screening examination/treatment/procedure(s) were conducted as a shared visit with non-physician practitioner(s) and myself.  I personally evaluated the patient during the encounter  Doug Sou, MD 12/28/12 [phone]

## 2012-12-28 NOTE — ED Provider Notes (Signed)
History     CSN: [id number]  Arrival date & time 12/28/12  1242   First MD Initiated Contact with Patient 12/28/12 1305      Chief Complaint  Patient presents with  . Dizziness  . Emesis    (Consider location/radiation/quality/duration/timing/severity/associated sxs/prior treatment) HPI Comments: Scott Yang is a 47 y/o M presenting to the ED with sudden onset of dizziness and emesis x 3 days ago. Patient reported that on Sunday night when walking to the bathroom, patient experienced a sudden onset of dizziness that lasted a couple of seconds and patient reported being fine after that. Patient reported no episodes on Monday, but stated that today at 12:00PM he experienced an episodes of dizziness after putting clothing on and dressing himself, the dizzy spell went away, but shortly after returned to the point the patient needed to hold onto something because thought he was going to lose balance. Patient stated that blurred vision is always associated with his dizzy spells, dizzy spells are sporadic and only last a couple of seconds. Patient reported 3 episodes of emesis today, mainly of liquid contents. Patient reported mild decrease in appetite and mild nausea feelings. Patient reported that over the past month he has been having increased frequency with urination and urgency - reported that it wakes him up at night - stated that he goes to the bathroom at least 5-6 times per day, not normal for him. Denied abdominal pain, syncope, diarrhea, constipation, loss of vision, eye pain, eye pressure, facial drooping, numbness or tingling to the face, facial asymmetry, chest pain shortness of breathe, difficulty breathing, bowel incontinence, urinary incontinence, neck pain, back pain.   PCP: Jovita Kussmaul  The history is provided by the patient. No language interpreter was used.    History reviewed. No pertinent past medical history.  History reviewed. No pertinent past surgical  history.  History reviewed. No pertinent family history.  History  Substance Use Topics  . Smoking status: Former Games developer  . Smokeless tobacco: Not on file  . Alcohol Use: No      Review of Systems  Constitutional: Positive for appetite change. Negative for fever and chills.  HENT: Negative for ear pain, congestion, sore throat, rhinorrhea, trouble swallowing, neck pain, neck stiffness and tinnitus.   Eyes: Positive for visual disturbance. Negative for photophobia and pain.  Respiratory: Negative for cough, chest tightness and shortness of breath.   Gastrointestinal: Positive for nausea and vomiting. Negative for abdominal pain, diarrhea and constipation.  Genitourinary: Positive for urgency and frequency. Negative for dysuria, decreased urine volume, difficulty urinating and penile pain.  Musculoskeletal: Negative for back pain, joint swelling and arthralgias.  Skin: Negative for rash and wound.  Neurological: Positive for dizziness. Negative for weakness, light-headedness, numbness and headaches.  All other systems reviewed and are negative.    Allergies  Review of patient's allergies indicates no known allergies.  Home Medications   Current Outpatient Rx  Name  Route  Sig  Dispense  Refill  . meclizine (ANTIVERT) 50 MG tablet   Oral   Take 0.5 tablets (25 mg total) by mouth 3 (three) times daily as needed.   30 tablet   0     BP 131/75  Pulse 58  Temp(Src) 97.6 F (36.4 C) (Oral)  Resp 19  SpO2 99%  Physical Exam  Nursing note and vitals reviewed. Constitutional: He is oriented to person, place, and time. He appears well-developed and well-nourished. No distress.  HENT:  Head: Normocephalic and atraumatic.  Mouth/Throat: Oropharynx is clear and moist. No oropharyngeal exudate.  Uvula midline, symmetrical elevation  Eyes: Conjunctivae and EOM are normal. Pupils are equal, round, and reactive to light. Right eye exhibits no discharge. Left eye exhibits no  discharge.  Neck: Normal range of motion. Neck supple. No tracheal deviation present.  Negative nuchal rigidity Negative lymphadenopathy  Cardiovascular: Normal rate, regular rhythm and normal heart sounds.   Radial pulses 2+ bilaterally  Pulmonary/Chest: Effort normal and breath sounds normal. No respiratory distress. He has no wheezes. He has no rales. He exhibits no tenderness.  Abdominal: Soft. Bowel sounds are normal. He exhibits no distension and no mass. There is no tenderness. There is no rebound and no guarding.  Musculoskeletal: Normal range of motion. He exhibits no edema and no tenderness.  Full ROM to upper and lower extremities bilaterally Strength 5+/5+ to upper and lower extremities bilaterally  Lymphadenopathy:    He has no cervical adenopathy.  Neurological: He is alert and oriented to person, place, and time. No cranial nerve deficit. He exhibits normal muscle tone. Coordination normal.  Skin: Skin is warm and dry. No rash noted. He is not diaphoretic. No erythema.  Psychiatric: He has a normal mood and affect. His behavior is normal. Thought content normal.    ED Course  Procedures (including critical care time)  2:49PM labs reviewed. CMP negative findings. CBC diminished WBC (3.3). EKG negative STEMI/NSTEMI, Troponin negative (0.00) - r/o MI. UA negative findings.  Visual acuity and orthostatics ordered   Date: 12/28/2012  Rate: 64  Rhythm: normal sinus rhythm  QRS Axis: normal  Intervals: normal  ST/T Wave abnormalities: nonspecific ST/T changes  Conduction Disutrbances:first-degree A-V block   Narrative Interpretation:   Old EKG Reviewed: unchanged    Labs Reviewed  CBC WITH DIFFERENTIAL - Abnormal; Notable for the following:    WBC 3.3 (*)    HCT 38.2 (*)    Neutro Abs 1.4 (*)    Eosinophils Relative 7 (*)    All other components within normal limits  COMPREHENSIVE METABOLIC PANEL - Abnormal; Notable for the following:    Glucose, Bld 107 (*)     All other components within normal limits  GLUCOSE, CAPILLARY - Abnormal; Notable for the following:    Glucose-Capillary 101 (*)    All other components within normal limits  URINALYSIS, ROUTINE W REFLEX MICROSCOPIC  POCT I-STAT TROPONIN I   Mr Brain Wo Contrast  12/28/2012  *RADIOLOGY REPORT*  Clinical Data: 47 year old male with dizziness and blurred vision. Off balance x2 days.  Nausea.  MRI HEAD WITHOUT CONTRAST  Technique:  Multiplanar, multiecho pulse sequences of the brain and surrounding structures were obtained according to standard protocol without intravenous contrast.  Comparison: None.  Findings: No restricted diffusion to suggest acute infarction.  Major intracranial vascular flow voids are preserved.  There is a degree of intracranial artery ectasia, as well as tortuosity of the cervical ICAs just below the skull base.  No midline shift, ventriculomegaly, mass effect, evidence of mass lesion, extra-axial collection or acute intracranial hemorrhage. Cervicomedullary junction and pituitary are within normal limits. Negative visualized cervical spine.  Occasional punctate areas of mostly subcortical cerebral white matter T2 and FLAIR hyperintensity.  Overall, gray and white matter signal is within normal limits for age.  Grossly normal visualized internal auditory structures.  Mastoids are clear.  Visualized orbit soft tissues are within normal limits.  Minimal paranasal sinus mucosal thickening, mostly in the maxillary sinuses.  Negative scalp soft tissues.  Normal  bone marrow signal.  IMPRESSION: 1. No acute intracranial abnormality. 2.  Evidence of generalized arterial ectasia. 3.  Otherwise normal for age noncontrast MRI appearance of the brain.   Original Report Authenticated By: Erskine Speed, M.D.      1. Vertigo       MDM  I personally examined and evaluated the patient. Patient afebrile, normotensive, non-tachycardic, alert and oriented. GCS 15. No neurovascular damage noted.  Negative findings with orthostatics. Visual acuity bilateral 20/20, right 20/30, left 20/25. UA, CMP, negative findings. CBC lowered WBC count (3.3) - decreased from 8 months ago (6.0). EKG negative STEMI/NSTEMI noted. MRI of brain no acute intracranial injury noted, arterial ectasia, normal appearance to brain for age of patient. Physical exam unremarkable. Cranial nerves III-XII grossly intact - negative deficits noted. One dose meclizine and zofran PO given in ED setting. Patient aseptic, non-toxic appearing, in no acute distress. MRI noted no intracranial injury. No sign of infection noted. Discharged patient. Suspected diagnosis vertigo, possible BPV. Discharged patient with meclizine to be used as needed - discussed course with patient. Discussed with patient to follow-up with PCP regarding incident and for re-check of WBC count levels that have decreased over the past 8 months. Discussed with patient to follow-up with urology regarding urinary frequency/urgency - discussed with patient to follow-up with Urgent Care Center. Discussed with patient to avoid fast motion and to rest often. Discussed with patient to monitor symptoms and if symptoms are to worsen or change to please report back to the ED. Patient agreed to plan of care, understood, all questions answered.              Raymon Mutton, PA-C 12/28/12 1701

## 2017-04-29 ENCOUNTER — Ambulatory Visit (INDEPENDENT_AMBULATORY_CARE_PROVIDER_SITE_OTHER): Payer: BLUE CROSS/BLUE SHIELD

## 2017-04-29 ENCOUNTER — Encounter: Payer: Self-pay | Admitting: Family Medicine

## 2017-04-29 ENCOUNTER — Ambulatory Visit (INDEPENDENT_AMBULATORY_CARE_PROVIDER_SITE_OTHER): Payer: BLUE CROSS/BLUE SHIELD | Admitting: Family Medicine

## 2017-04-29 VITALS — BP 148/88 | HR 83 | Temp 98.3°F | Resp 17 | Ht 70.5 in | Wt 278.0 lb

## 2017-04-29 DIAGNOSIS — R030 Elevated blood-pressure reading, without diagnosis of hypertension: Secondary | ICD-10-CM | POA: Diagnosis not present

## 2017-04-29 DIAGNOSIS — R7303 Prediabetes: Secondary | ICD-10-CM | POA: Diagnosis not present

## 2017-04-29 DIAGNOSIS — R079 Chest pain, unspecified: Secondary | ICD-10-CM

## 2017-04-29 DIAGNOSIS — H6122 Impacted cerumen, left ear: Secondary | ICD-10-CM | POA: Diagnosis not present

## 2017-04-29 DIAGNOSIS — Z87891 Personal history of nicotine dependence: Secondary | ICD-10-CM

## 2017-04-29 DIAGNOSIS — R42 Dizziness and giddiness: Secondary | ICD-10-CM | POA: Diagnosis not present

## 2017-04-29 LAB — COMPREHENSIVE METABOLIC PANEL
A/G RATIO: 1.4 (ref 1.2–2.2)
ALK PHOS: 91 IU/L (ref 39–117)
ALT: 52 IU/L — ABNORMAL HIGH (ref 0–44)
AST: 37 IU/L (ref 0–40)
Albumin: 4 g/dL (ref 3.5–5.5)
BILIRUBIN TOTAL: 0.3 mg/dL (ref 0.0–1.2)
BUN/Creatinine Ratio: 14 (ref 9–20)
BUN: 14 mg/dL (ref 6–24)
CALCIUM: 9.1 mg/dL (ref 8.7–10.2)
CHLORIDE: 106 mmol/L (ref 96–106)
CO2: 24 mmol/L (ref 20–29)
Creatinine, Ser: 0.97 mg/dL (ref 0.76–1.27)
GFR calc Af Amer: 105 mL/min/{1.73_m2} (ref >59)
GFR, EST NON AFRICAN AMERICAN: 91 mL/min/{1.73_m2} (ref >59)
Globulin, Total: 2.9 g/dL (ref 1.5–4.5)
Glucose: 87 mg/dL (ref 65–99)
POTASSIUM: 4.4 mmol/L (ref 3.5–5.2)
SODIUM: 141 mmol/L (ref 134–144)
Total Protein: 6.9 g/dL (ref 6.0–8.5)

## 2017-04-29 LAB — POCT GLYCOSYLATED HEMOGLOBIN (HGB A1C): Hemoglobin A1C: 5.8

## 2017-04-29 LAB — POCT CBC
GRANULOCYTE PERCENT: 53.7 % (ref 37–80)
HEMATOCRIT: 39.5 % — AB (ref 43.5–53.7)
HEMOGLOBIN: 13.2 g/dL — AB (ref 14.1–18.1)
Lymph, poc: 1.9 (ref 0.6–3.4)
MCH: 30.2 pg (ref 27–31.2)
MCHC: 33.3 g/dL (ref 31.8–35.4)
MCV: 90.6 fL (ref 80–97)
MID (cbc): 0.4 (ref 0–0.9)
MPV: 7.4 fL (ref 0–99.8)
PLATELET COUNT, POC: 229 10*3/uL (ref 142–424)
POC Granulocyte: 2.7 (ref 2–6.9)
POC LYMPH PERCENT: 38.2 %L (ref 10–50)
POC MID %: 8.1 %M (ref 0–12)
RBC: 4.36 M/uL — AB (ref 4.69–6.13)
RDW, POC: 13.2 %
WBC: 5 10*3/uL (ref 4.6–10.2)

## 2017-04-29 LAB — POCT SEDIMENTATION RATE: POCT SED RATE: 40 mm/h — AB (ref 0–22)

## 2017-04-29 LAB — TROPONIN I: Troponin I: 0.01 ng/mL (ref 0.00–0.04)

## 2017-04-29 MED ORDER — MECLIZINE HCL 25 MG PO TABS: 25.0000 mg | ORAL_TABLET | Freq: Three times a day (TID) | ORAL | 0 refills | Status: DC

## 2017-04-29 MED ORDER — OMEPRAZOLE 40 MG PO CPDR: 40.0000 mg | DELAYED_RELEASE_CAPSULE | Freq: Every day | ORAL | 3 refills | Status: DC

## 2017-04-29 MED ORDER — FLUTICASONE PROPIONATE 50 MCG/ACT NA SUSP: 2.0000 | Freq: Every day | NASAL | 2 refills | Status: DC

## 2017-04-29 MED ORDER — GI COCKTAIL ~~LOC~~: 30.0000 mL | Freq: Once | ORAL | Status: DC

## 2017-04-29 NOTE — Progress Notes (Signed)
Subjective:  By signing my name below, I, Scott Yang, attest that this documentation has been prepared under the direction and in the presence of Scott Sorenson, MD Electronically Signed: Charline Yang, ED Scribe 04/29/2017 at 3:43 PM.   Patient ID: Scott Yang, male    DOB: [birth year], 51 y.o.   MRN: [id number]  Chief Complaint  Patient presents with  . Dizziness  . Chest Pain   HPI Scott Yang is a 51 y.o. male who presents to Primary Care at [facility] complaining of intermittent non-radiating left-sided chest pain that he describes as pressure over the past few weeks. Pt states that pain returned today after resting at a table shortly after working. He does concrete work and a Counsellor. Pt is experiencing chest pain at this time. He reports increased pain with taking deep breaths but pain is not exacerbated with palpation or lifting heavy objects. Also reports intermittent dizziness x 2 weeks with lying and standing and associated HAs. Reports similar dizziness in 2014 and was started on Meclizine which resolved dizziness. He hs not taken aspirin or any other medications for treatment PTA. Denies nausea, diaphoresis, sob, indigestion, heartburn, wheezing, recent URI, leg swelling, light-headedness. Pt stopped smoking 8 months ago.  No past medical history on file.  Current Outpatient Prescriptions on File Prior to Visit  Medication Sig Dispense Refill  . meclizine (ANTIVERT) 50 MG tablet Take 0.5 tablets (25 mg total) by mouth 3 (three) times daily as needed. (Patient not taking: Reported on 04/29/2017) 30 tablet 0   No current facility-administered medications on file prior to visit.    No Known Allergies  Review of Systems  Constitutional: Negative for diaphoresis.  Respiratory: Negative for shortness of breath and wheezing.   Cardiovascular: Positive for chest pain. Negative for leg swelling.  Gastrointestinal: Negative for nausea.  Neurological: Positive for  dizziness and headaches. Negative for light-headedness.      Objective:   Physical Exam  Constitutional: He is oriented to person, place, and time. He appears well-developed and well-nourished. No distress.  HENT:  Head: Normocephalic and atraumatic.  Right Ear: Tympanic membrane is injected and retracted.  Mouth/Throat: Oropharynx is clear and moist and mucous membranes are normal.  L: cerumen Nares: pale and boggy, R worse than L  Eyes: Conjunctivae are normal. Right eye exhibits no nystagmus. Left eye exhibits no nystagmus.  Negative Dix Hallpike but slight symptoms on R.  Neck: Neck supple. No tracheal deviation present. No thyromegaly present.  Cardiovascular: Normal rate, regular rhythm and normal heart sounds.   No murmur heard. Pulmonary/Chest: Effort normal. No respiratory distress. He has wheezes (anterior). He exhibits no tenderness.  Upper respiratory breath sounds. Bowel sounds heard in chest.  Musculoskeletal: Normal range of motion.  Lymphadenopathy:    He has no cervical adenopathy.  Neurological: He is alert and oriented to person, place, and time.  Skin: Skin is warm and dry.  Psychiatric: He has a normal mood and affect. His behavior is normal.  Nursing note and vitals reviewed.  EKG reading done by Scott Sorenson, MD: normal sinus rhythm. No acute ischemic changes. Unchanged when compared to EKG done 12/28/12.  Results for orders placed or performed in visit on 04/29/17  POCT CBC  Result Value Ref Range   WBC 5.0 4.6 - 10.2 K/uL   Lymph, poc 1.9 0.6 - 3.4   POC LYMPH PERCENT 38.2 10 - 50 %L   MID (cbc) 0.4 0 - 0.9   POC MID %  8.1 0 - 12 %M   POC Granulocyte 2.7 2 - 6.9   Granulocyte percent 53.7 37 - 80 %G   RBC 4.36 (A) 4.69 - 6.13 M/uL   Hemoglobin 13.2 (A) 14.1 - 18.1 g/dL   HCT, POC 16.1 (A) 09.6 - 53.7 %   MCV 90.6 80 - 97 fL   MCH, POC 30.2 27 - 31.2 pg   MCHC 33.3 31.8 - 35.4 g/dL   RDW, POC 04.5 %   Platelet Count, POC 229 142 - 424 K/uL   MPV 7.4 0  - 99.8 fL  POCT SEDIMENTATION RATE  Result Value Ref Range   POCT SED RATE 40 (A) 0 - 22 mm/hr  POCT glycosylated hemoglobin (Hb A1C)  Result Value Ref Range   Hemoglobin A1C 5.8   Previous hemoglobin consistent with anemia.  Dg Chest 2 View  Result Date: 04/29/2017 CLINICAL DATA:  Intermittent left-sided pleuritic chest pain for the past week ; former smoker. History of hypertension. EXAM: CHEST  2 VIEW COMPARISON:  Chest x-ray of May 27, 2007 FINDINGS: The lungs are adequately inflated and clear. There is no pleural effusion, pneumothorax, or pneumomediastinum. Stable minimal biapical pleural thickening is present. The heart is normal in size. The pulmonary vascularity is not engorged. The mediastinum is normal in width. There is mild multilevel degenerative disc disease of the thoracic spine. IMPRESSION: No active cardiopulmonary disease. Electronically Signed   By: David  [place] M.D.   On: 04/29/2017 16:38   BP (!) 148/88   Pulse 83   Temp 98.3 F (36.8 C) (Oral)   Resp 17   Ht 5' 10.5" (1.791 m)   Wt 278 lb (126.1 kg)   SpO2 98%   BMI 39.33 kg/m    Orthostatic VS for the past 24 hrs:  BP- Lying Pulse- Lying BP- Sitting Pulse- Sitting BP- Standing at 0 minutes Pulse- Standing at 0 minutes  04/29/17 1625 (!) 136/94 80 (!) 136/94 83 (!) 138/100 84   Assessment & Plan:   1. Vertigo   2. Chest pain, unspecified type   3. History of tobacco use   4. Elevated blood pressure reading   5. Left ear impacted cerumen   6. Prediabetes     Orders Placed This Encounter  Procedures  . DG Chest 2 View    Standing Status:   Future    Number of Occurrences:   1    Standing Expiration Date:   04/29/2018    Order Specific Question:   Reason for Exam (SYMPTOM  OR DIAGNOSIS REQUIRED)    Answer:   intermittent left sided pleuritic chest pain    Order Specific Question:   Preferred imaging location?    Answer:   External  . Troponin I  . Comprehensive metabolic panel  .  Comprehensive metabolic panel  . Ambulatory referral to Cardiology    Referral Priority:   Routine    Referral Type:   Consultation    Referral Reason:   Specialty Services Required    Requested Specialty:   Cardiology    Number of Visits Requested:   1  . Orthostatic vital signs  . Ear wax removal  . POCT CBC  . POCT SEDIMENTATION RATE  . POCT glycosylated hemoglobin (Hb A1C)  . EKG 12-Lead    Meds ordered this encounter  Medications  . Multiple Vitamins-Minerals (MULTIVITAMIN WITH MINERALS) tablet    Sig: Take 1 tablet by mouth daily.  . Probiotic Product (PROBIOTIC-10 PO)    Sig:  Take by mouth.  . TURMERIC PO    Sig: Take by mouth.  . vitamin B-12 (CYANOCOBALAMIN) 100 MCG tablet    Sig: Take 100 mcg by mouth daily.  Marland Kitchen. gi cocktail (Maalox,Lidocaine,Donnatal)  . omeprazole (PRILOSEC) 40 MG capsule    Sig: Take 1 capsule (40 mg total) by mouth daily. 30 minutes before a meal    Dispense:  30 capsule    Refill:  3  . meclizine (ANTIVERT) 25 MG tablet    Sig: Take 1 tablet (25 mg total) by mouth 3 (three) times daily. To prevent dizziness    Dispense:  30 tablet    Refill:  0  . fluticasone (FLONASE) 50 MCG/ACT nasal spray    Sig: Place 2 sprays into both nostrils at bedtime.    Dispense:  16 g    Refill:  2    I personally performed the services described in this documentation, which was scribed in my presence. The recorded information has been reviewed and considered, and addended by me as needed.   Scott SorensonEva Shaw, M.D.  [facility] at [facility]  [place] [address] [place], [place] [address] [phone] phone [phone] fax  05/02/17 8:39 AM

## 2017-04-29 NOTE — Patient Instructions (Addendum)
IF you received an x-ray today, you will receive an invoice from [facility]. Please contact [facility] at [phone] with questions or concerns regarding your invoice.   IF you received labwork today, you will receive an invoice from [facility]. Please contact [facility] at [phone] with questions or concerns regarding your invoice.   Our billing staff will not be able to assist you with questions regarding bills from these companies.  You will be contacted with the lab results as soon as they are available. The fastest way to get your results is to activate your My Chart account. Instructions are located on the last page of this paperwork. If you have not heard from [place] regarding the results in 2 weeks, please contact this office.     Angina Pectoris Angina pectoris, often called angina, is extreme discomfort in the chest, neck, or arm. This is caused by a lack of blood in the middle and thickest layer of the heart wall (myocardium). There are four types of angina:  Stable angina. Stable angina usually occurs in episodes of predictable frequency and duration. It is usually brought on by physical activity, stress, or excitement. Stable angina usually lasts a few minutes and can often be relieved by a medicine that you place under your tongue. This medicine is called sublingual nitroglycerin.  Unstable angina. Unstable angina can occur even when you are doing little or no physical activity. It can even occur while you are sleeping or when you are at rest. It can suddenly increase in severity or frequency. It may not be relieved by sublingual nitroglycerin, and it can last up to 30 minutes.  Microvascular angina. This type of angina is caused by a disorder of tiny blood vessels called arterioles. Microvascular angina is more common in women. The pain may be more severe and last longer than other types of angina pectoris.  Prinzmetal or variant angina. This type of angina  pectoris is rare and usually occurs when you are doing little or no physical activity. It especially occurs in the early morning hours.  What are the causes? Atherosclerosis is the cause of angina. This is the buildup of fat and cholesterol (plaque) on the inside of the arteries. Over time, the plaque may narrow or block the artery, and this will lessen blood flow to the heart. Plaque can also become weak and break off within a coronary artery to form a clot and cause a sudden blockage. What increases the risk? Risk factors common to both men and women include:  High cholesterol levels.  High blood pressure (hypertension).  Tobacco use.  Diabetes.  Family history of angina.  Obesity.  Lack of exercise.  A diet high in saturated fats.  Women are at greater risk for angina if they are:  Over age 5.  Postmenopausal.  What are the signs or symptoms? Many people do not experience any symptoms during the early stages of angina. As the condition progresses, symptoms common to both men and women may include:  Chest pain. ? The pain can be described as a crushing or squeezing in the chest, or a tightness, pressure, fullness, or heaviness in the chest. ? The pain can last more than a few minutes, or it can stop and recur.  Pain in the arms, neck, jaw, or back.  Unexplained heartburn or indigestion.  Shortness of breath.  Nausea.  Sudden cold sweats.  Sudden light-headedness.  Many women have chest discomfort and some of the other symptoms. However,  women often have different (atypical) symptoms, such as:  Fatigue.  Unexplained feelings of nervousness or anxiety.  Unexplained weakness.  Dizziness or fainting.  Sometimes, women may have angina without any symptoms. How is this diagnosed? Tests to diagnose angina may include:  ECG (electrocardiogram).  Exercise stress test. This looks for signs of blockage when the heart is being exercised.  Pharmacologic stress  test. This test looks for signs of blockage when the heart is being stressed with a medicine.  Blood tests.  Coronary angiogram. This is a procedure to look at the coronary arteries to see if there is any blockage.  How is this treated? The treatment of angina may include the following:  Healthy behavioral changes to reduce or control risk factors.  Medicine.  Coronary stenting.A stent helps to keep an artery open.  Coronary angioplasty. This procedure widens a narrowed or blocked artery.  Coronary arterybypass surgery. This will allow your blood to pass the blockage (bypass) to reach your heart.  Follow these instructions at home:  Take medicines only as directed by your health care provider.  Do not take the following medicines unless your health care provider approves: ? Nonsteroidal anti-inflammatory drugs (NSAIDs), such as ibuprofen, naproxen, or celecoxib. ? Vitamin supplements that contain vitamin A, vitamin E, or both. ? Hormone replacement therapy that contains estrogen with or without progestin.  Manage other health conditions such as hypertension and diabetes as directed by your health care provider.  Follow a heart-healthy diet. A dietitian can help to educate you about healthy food options and changes.  Use healthy cooking methods such as roasting, grilling, broiling, baking, poaching, steaming, or stir-frying. Talk to a dietitian to learn more about healthy cooking methods.  Follow an exercise program approved by your health care provider.  Maintain a healthy weight. Lose weight as approved by your health care provider.  Plan rest periods when fatigued.  Learn to manage stress.  Do not use any tobacco products, including cigarettes, chewing tobacco, or electronic cigarettes. If you need help quitting, ask your health care provider.  If you drink alcohol, and your health care provider approves, limit your alcohol intake to no more than 1 drink per day. One  drink equals 12 ounces of beer, 5 ounces of wine, or 1 ounces of hard liquor.  Stop illegal drug use.  Keep all follow-up visits as directed by your health care provider. This is important. Get help right away if:  You have pain in your chest, neck, arm, jaw, stomach, or back that lasts more than a few minutes, is recurring, or is unrelieved by taking sublingualnitroglycerin.  You have profuse sweating without cause.  You have unexplained: ? Heartburn or indigestion. ? Shortness of breath or difficulty breathing. ? Nausea or vomiting. ? Fatigue. ? Feelings of nervousness or anxiety. ? Weakness. ? Diarrhea.  You have sudden light-headedness or dizziness.  You faint. These symptoms may represent a serious problem that is an emergency. Do not wait to see if the symptoms will go away. Get medical help right away. Call your local emergency services (911 in the [place] Do not drive yourself to the hospital. This information is not intended to replace advice given to you by your health care provider. Make sure you discuss any questions you have with your health care provider. Document Released: 08/18/2005 Document Revised: 01/30/2016 Document Reviewed: 12/20/2013 Elsevier Interactive Patient Education  2017 [employer]  Vertigo Vertigo is the feeling that you or your surroundings are moving when they  are not. Vertigo can be dangerous if it occurs while you are doing something that could endanger you or others, such as driving. What are the causes? This condition is caused by a disturbance in the signals that are sent by your body's sensory systems to your brain. Different causes of a disturbance can lead to vertigo, including:  Infections, especially in the inner ear.  A bad reaction to a drug, or misuse of alcohol and medicines.  Withdrawal from drugs or alcohol.  Quickly changing positions, as when lying down or rolling over in bed.  Migraine headaches.  Decreased blood flow  to the brain.  Decreased blood pressure.  Increased pressure in the brain from a head or neck injury, stroke, infection, tumor, or bleeding.  Central nervous system disorders.  What are the signs or symptoms? Symptoms of this condition usually occur when you move your head or your eyes in different directions. Symptoms may start suddenly, and they usually last for less than a minute. Symptoms may include:  Loss of balance and falling.  Feeling like you are spinning or moving.  Feeling like your surroundings are spinning or moving.  Nausea and vomiting.  Blurred vision or double vision.  Difficulty hearing.  Slurred speech.  Dizziness.  Involuntary eye movement (nystagmus).  Symptoms can be mild and cause only slight annoyance, or they can be severe and interfere with daily life. Episodes of vertigo may return (recur) over time, and they are often triggered by certain movements. Symptoms may improve over time. How is this diagnosed? This condition may be diagnosed based on medical history and the quality of your nystagmus. Your health care provider may test your eye movements by asking you to quickly change positions to trigger the nystagmus. This may be called the Dix-Hallpike test, head thrust test, or roll test. You may be referred to a health care provider who specializes in ear, nose, and throat (ENT) problems (otolaryngologist) or a provider who specializes in disorders of the central nervous system (neurologist). You may have additional testing, including:  A physical exam.  Blood tests.  MRI.  A CT scan.  An electrocardiogram (ECG). This records electrical activity in your heart.  An electroencephalogram (EEG). This records electrical activity in your brain.  Hearing tests.  How is this treated? Treatment for this condition depends on the cause and the severity of the symptoms. Treatment options include:  Medicines to treat nausea or vertigo. These are  usually used for severe cases. Some medicines that are used to treat other conditions may also reduce or eliminate vertigo symptoms. These include: ? Medicines that control allergies (antihistamines). ? Medicines that control seizures (anticonvulsants). ? Medicines that relieve depression (antidepressants). ? Medicines that relieve anxiety (sedatives).  Head movements to adjust your inner ear back to normal. If your vertigo is caused by an ear problem, your health care provider may recommend certain movements to correct the problem.  Surgery. This is rare.  Follow these instructions at home: Safety  Move slowly.Avoid sudden body or head movements.  Avoid driving.  Avoid operating heavy machinery.  Avoid doing any tasks that would cause danger to you or others if you would have a vertigo episode during the task.  If you have trouble walking or keeping your balance, try using a cane for stability. If you feel dizzy or unstable, sit down right away.  Return to your normal activities as told by your health care provider. Ask your health care provider what activities are safe for  you. General instructions  Take over-the-counter and prescription medicines only as told by your health care provider.  Avoid certain positions or movements as told by your health care provider.  Drink enough fluid to keep your urine clear or pale yellow.  Keep all follow-up visits as told by your health care provider. This is important. Contact a health care provider if:  Your medicines do not relieve your vertigo or they make it worse.  You have a fever.  Your condition gets worse or you develop new symptoms.  Your family or friends notice any behavioral changes.  Your nausea or vomiting gets worse.  You have numbness or a "pins and needles" sensation in part of your body. Get help right away if:  You have difficulty moving or speaking.  You are always dizzy.  You faint.  You develop severe  headaches.  You have weakness in your hands, arms, or legs.  You have changes in your hearing or vision.  You develop a stiff neck.  You develop sensitivity to light. This information is not intended to replace advice given to you by your health care provider. Make sure you discuss any questions you have with your health care provider. Document Released: 05/28/2005 Document Revised: 01/30/2016 Document Reviewed: 12/11/2014 Elsevier Interactive Patient Education  2017 [employer].

## 2017-04-30 LAB — COMPREHENSIVE METABOLIC PANEL
ALK PHOS: 91 IU/L (ref 39–117)
ALT: 54 IU/L — AB (ref 0–44)
AST: 38 IU/L (ref 0–40)
Albumin/Globulin Ratio: 1.4 (ref 1.2–2.2)
Albumin: 4.1 g/dL (ref 3.5–5.5)
BILIRUBIN TOTAL: 0.2 mg/dL (ref 0.0–1.2)
BUN/Creatinine Ratio: 14 (ref 9–20)
BUN: 13 mg/dL (ref 6–24)
CHLORIDE: 106 mmol/L (ref 96–106)
CO2: 23 mmol/L (ref 20–29)
Calcium: 9 mg/dL (ref 8.7–10.2)
Creatinine, Ser: 0.94 mg/dL (ref 0.76–1.27)
GFR calc Af Amer: 109 mL/min/{1.73_m2} (ref >59)
GFR calc non Af Amer: 94 mL/min/{1.73_m2} (ref >59)
GLUCOSE: 88 mg/dL (ref 65–99)
Globulin, Total: 3 g/dL (ref 1.5–4.5)
Potassium: 4.4 mmol/L (ref 3.5–5.2)
Sodium: 141 mmol/L (ref 134–144)
TOTAL PROTEIN: 7.1 g/dL (ref 6.0–8.5)

## 2017-05-06 ENCOUNTER — Ambulatory Visit: Payer: BLUE CROSS/BLUE SHIELD | Admitting: Family Medicine

## 2017-05-08 ENCOUNTER — Ambulatory Visit: Payer: Self-pay | Admitting: Internal Medicine

## 2017-05-11 ENCOUNTER — Ambulatory Visit: Payer: BLUE CROSS/BLUE SHIELD | Admitting: Family Medicine

## 2017-05-11 NOTE — Progress Notes (Shared)
° °  Subjective:  By signing my name below, I, Stann Oresung-Kai Tsai, attest that this documentation has been prepared under the direction and in the presence of Norberto SorensonEva Shaw, MD. Electronically Signed: Stann Oresung-Kai Tsai, Scribe. 05/11/2017 , 12:03 PM .  Patient was seen in Room *** .   Patient ID: Charlton HawsJammie A Armenti, male    DOB: 11/11/1965, 51 y.o.   MRN: [id number] No chief complaint on file.  HPI  Jedediah A Tabbert is a 51 y.o. male who [facility] complaining of ***   Patient was seen 2 weeks prior with several weeks of atypical left sided chest pain. No prior regular medical care, as he recently obtained insurance and plans to establish.   Chest pain Patient was having pain in the office and pleuritic. Work up revealed negative orthostatics, EKG unchanged since 2014. He was wheezing on exam. His A1C was 5.8 and sed rate mildly elevated at 40, normal CBC. Chest xray was normal. Patient had moderated improvement in symptoms with GI cocktail, though did not resolve. Patient was started on prilosec 40mg  and asked to recheck in 1 week, or seen in ER if worsening. Referral to cardiology was placed, which has been cancelled; appointment was scheduled for 05/08/17.    Vertigo Patient has history of intermittent symptoms resolved prior with meclizine. Reported with positional changes and associated with headaches. He did have some mild symptoms with dix hallpike. Left impacted cerumen removed and started on meclizine and flonase. See labs, had negative troponin and normal CMP, other than mildly elevated ALT.    Mild thoracic DDD ***   No past medical history on file. Prior to Admission medications   Medication Sig Start Date End Date Taking? Authorizing Provider  fluticasone (FLONASE) 50 MCG/ACT nasal spray Place 2 sprays into both nostrils at bedtime. 04/29/17   Sherren MochaShaw, Eva N, MD  meclizine (ANTIVERT) 25 MG tablet Take 1 tablet (25 mg total) by mouth 3 (three) times daily. To prevent  dizziness 04/29/17   Sherren MochaShaw, Eva N, MD  Multiple Vitamins-Minerals (MULTIVITAMIN WITH MINERALS) tablet Take 1 tablet by mouth daily.    [provider]  omeprazole (PRILOSEC) 40 MG capsule Take 1 capsule (40 mg total) by mouth daily. 30 minutes before a meal 04/29/17   Sherren MochaShaw, Eva N, MD  Probiotic Product (PROBIOTIC-10 PO) Take by mouth.    [provider]  TURMERIC PO Take by mouth.    [provider]  vitamin B-12 (CYANOCOBALAMIN) 100 MCG tablet Take 100 mcg by mouth daily.    [provider]   No Known Allergies  Review of Systems     Objective:   Physical Exam  Constitutional: He is oriented to person, place, and time. He appears well-developed and well-nourished. No distress.  HENT:  Head: Normocephalic and atraumatic.  Eyes: Pupils are equal, round, and reactive to light. EOM are normal.  Neck: Neck supple.  Cardiovascular: Normal rate.   Pulmonary/Chest: Effort normal. No respiratory distress.  Musculoskeletal: Normal range of motion.  Neurological: He is alert and oriented to person, place, and time.  Skin: Skin is warm and dry.  Psychiatric: He has a normal mood and affect. His behavior is normal.  Nursing note and vitals reviewed.   There were no vitals taken for this visit.     Assessment & Plan:

## 2017-05-13 NOTE — Progress Notes (Deleted)
Subjective:     Patient ID: Scott Yang, male    DOB: [birth year], 51 y.o.   MRN: [id number]  No chief complaint on file.  HPI Scott Yang is a 51 y.o. male who presents to Primary Care at [facility] for a 2 week follow-up visit after he presented as a new patient with atypical left-sided chest pain.  Chest pain Patient was having pain in the office and pleuritic. Work up revealed negative orthostatics, EKG unchanged since 2014. He was wheezing on exam. His A1C was 5.8 and sed rate mildly elevated at 40, normal CBC. Chest xray was normal. Patient had moderated improvement in symptoms with GI cocktail, though did not resolve. Patient was started on prilosec  and asked to recheck in 1 week, or seen in ER if worsening. Referral to cardiology was placed - appt was sched for 05/08/17 and was then cancelled.    Vertigo Patient has history of intermittent symptoms resolved prior with meclizine. Reported with positional changes and associated with headaches. He did have some mild symptoms with dix hallpike. Left impacted cerumen removed and started on meclizine and flonase. See labs, had negative troponin and normal CMP, other than mildly elevated ALT.    Mild thoracic DDD   No past medical history on file.  Current Outpatient Prescriptions on File Prior to Visit  Medication Sig Dispense Refill  . fluticasone (FLONASE) 50 MCG/ACT nasal spray Place 2 sprays into both nostrils at bedtime. 16 g 2  . meclizine (ANTIVERT) 25 MG tablet Take 1 tablet (25 mg total) by mouth 3 (three) times daily. To prevent dizziness 30 tablet 0  . Multiple Vitamins-Minerals (MULTIVITAMIN WITH MINERALS) tablet Take 1 tablet by mouth daily.    Marland Kitchen omeprazole (PRILOSEC) 40 MG capsule Take 1 capsule (40 mg total) by mouth daily. 30 minutes before a meal 30 capsule 3  . Probiotic Product (PROBIOTIC-10 PO) Take by mouth.    . TURMERIC PO Take by mouth.    . vitamin B-12 (CYANOCOBALAMIN) 100 MCG tablet Take 100  mcg by mouth daily.     Current Facility-Administered Medications on File Prior to Visit  Medication Dose Route Frequency Provider Last Rate Last Dose  . gi cocktail (Maalox,Lidocaine,Donnatal)  30 mL Oral Once Sherren Mocha, MD       No Known Allergies  Review of Systems  Constitutional: Negative for diaphoresis.  Respiratory: Negative for shortness of breath and wheezing.   Cardiovascular: Positive for chest pain. Negative for leg swelling.  Gastrointestinal: Negative for nausea.  Neurological: Positive for dizziness and headaches. Negative for light-headedness.      Objective:   Physical Exam  Constitutional: He is oriented to person, place, and time. He appears well-developed and well-nourished. No distress.  HENT:  Head: Normocephalic and atraumatic.  Right Ear: Tympanic membrane is injected and retracted.  Mouth/Throat: Oropharynx is clear and moist and mucous membranes are normal.  L: cerumen Nares: pale and boggy, R worse than L  Eyes: Conjunctivae are normal. Right eye exhibits no nystagmus. Left eye exhibits no nystagmus.  Negative Dix Hallpike but slight symptoms on R.  Neck: Neck supple. No tracheal deviation present. No thyromegaly present.  Cardiovascular: Normal rate, regular rhythm and normal heart sounds.   No murmur heard. Pulmonary/Chest: Effort normal. No respiratory distress. He has wheezes (anterior). He exhibits no tenderness.  Upper respiratory breath sounds. Bowel sounds heard in chest.  Musculoskeletal: Normal range of motion.  Lymphadenopathy:    He has no cervical adenopathy.  Neurological: He is alert and oriented to person, place, and time.  Skin: Skin is warm and dry.  Psychiatric: He has a normal mood and affect. His behavior is normal.  Nursing note and vitals reviewed.    There were no vitals taken for this visit.    Assessment & Plan:    Scott Yang, M.D.  [facility] at [facility]  [place] [address] [place], [place]  [id number] [phone] phone [phone] fax  05/13/17 5:26 PM

## 2017-05-13 NOTE — Progress Notes (Deleted)
Kkl;;

## 2017-05-14 ENCOUNTER — Ambulatory Visit: Payer: BLUE CROSS/BLUE SHIELD | Admitting: Family Medicine

## 2017-05-21 NOTE — Progress Notes (Signed)
Cardiology Office Note   Date:  05/25/2017   ID:  [name], DOB 1966-01-30, MRN [id number]  PCP:  Sherren Mocha, MD  Cardiologist:   Rollene Rotunda, MD  Referring:  Sherren Mocha, MD  Chief Complaint  Patient presents with  . Chest Pain      History of Present Illness: Scott Yang is a 51 y.o. male who presents for evaluation of chest pain. He's had no prior cardiac history. Recently he was working in his job pouring concrete. He developed sharp pain when he was pulling back on the scraper to level the concrete.  He said that this cause some achiness in his chest that were hurt to move his arm a little bit. He was left upper discomfort. It was mild to moderate when it settled down but sharp first occurred. There are no associated symptoms. He's not had any shortness of breath, PND or orthopnea. He's not had any chest pressure, neck or arm discomfort. He's had no palpitations, presyncope or syncope. He otherwise works vigorously without bringing on any symptoms.   Of note he did see his primary provider and he had a normal troponin.   EKG demonstrated no acute changes.    PMH:  None  PSH:  None   No past surgical history on file.   Current Outpatient Prescriptions  Medication Sig Dispense Refill  . Multiple Vitamins-Minerals (MULTIVITAMIN WITH MINERALS) tablet Take 1 tablet by mouth daily.    Marland Kitchen omeprazole (PRILOSEC) 40 MG capsule Take 1 capsule (40 mg total) by mouth daily. 30 minutes before a meal 30 capsule 3  . Probiotic Product (PROBIOTIC-10 PO) Take by mouth.    . TURMERIC PO Take by mouth.    . vitamin B-12 (CYANOCOBALAMIN) 100 MCG tablet Take 100 mcg by mouth daily.     No current facility-administered medications for this visit.     Allergies:   Patient has no known allergies.    Social History:  The patient  reports that he has quit smoking. His smoking use included Cigarettes. He has a 20.00 pack-year smoking history. He has never used smokeless tobacco.  He reports that he does not drink alcohol or use drugs.   Family History:  The patient's family history includes Diabetes type II in his sister; Heart disease (age of onset: 66) in his mother; Hypertension in his father and mother.    ROS:  Please see the history of present illness.   Otherwise, review of systems are positive for none.   All other systems are reviewed and negative.    PHYSICAL EXAM: VS:  BP (!) 144/90   Pulse 86   Ht  (1.778 m)   Wt 280 lb (127 kg)   BMI 40.18 kg/m  , BMI Body mass index is 40.18 kg/m. GENERAL:  Well appearing HEENT:  Pupils equal round and reactive, fundi not visualized, oral mucosa unremarkable NECK:  No jugular venous distention, waveform within normal limits, carotid upstroke brisk and symmetric, no bruits, no thyromegaly LYMPHATICS:  No cervical, inguinal adenopathy LUNGS:  Clear to auscultation bilaterally BACK:  No CVA tenderness CHEST:  Unremarkable HEART:  PMI not displaced or sustained,S1 and S2 within normal limits, no S3, no S4, no clicks, no rubs, no murmurs ABD:  Flat, positive bowel sounds normal in frequency in pitch, no bruits, no rebound, no guarding, no midline pulsatile mass, no hepatomegaly, no splenomegaly EXT:  2 plus pulses throughout, no edema, no cyanosis no clubbing  SKIN:  No rashes no nodules NEURO:  Cranial nerves II through XII grossly intact, motor grossly intact throughout PSYCH:  Cognitively intact, oriented to person place and time    EKG:  EKG is ordered today. The ekg ordered today demonstrates sinus rhythm, rate 78, axis within normal limits, intervals within normal limits, no acute ST-T wave changes.   Recent Labs: 04/29/2017: ALT 54; BUN 13; Creatinine, Ser 0.94; Hemoglobin 13.2; Potassium 4.4; Sodium 141    Lipid Panel No results found for: CHOL, TRIG, HDL, CHOLHDL, VLDL, LDLCALC, LDLDIRECT        Wt Readings from Last 3 Encounters:  05/22/17 280 lb (127 kg)  04/29/17 278 lb (126.1 kg)        Other studies Reviewed: Additional studies/ records that were reviewed today include: Labs. Review of the above records demonstrates:  Please see elsewhere in the note.     ASSESSMENT AND PLAN:  CHEST PAIN :  This is very atypical.  There is no objective evidence that this was cardiac.  It is likely musculoskeletal.  The pretest probability of obstructive coronary disease is very low. Therefore, no further cardiovascular testing is suggested.   RISK REDUCTION:  I will check a lipid profile.    OBESITY:  We had a long discussion about diet and exercise.     Current medicines are reviewed at length with the patient today.  The patient does not have concerns regarding medicines.  The following changes have been made:  no change  Labs/ tests ordered today include: None  Orders Placed This Encounter  Procedures  . Lipid panel     Disposition:   FU with me as needed.      Signed, Rollene Rotunda, MD  05/25/2017 12:38 PM    [facility]

## 2017-05-22 ENCOUNTER — Encounter: Payer: Self-pay | Admitting: Cardiology

## 2017-05-22 ENCOUNTER — Ambulatory Visit (INDEPENDENT_AMBULATORY_CARE_PROVIDER_SITE_OTHER): Payer: BLUE CROSS/BLUE SHIELD | Admitting: Cardiology

## 2017-05-22 VITALS — BP 144/90 | HR 86 | Ht 70.0 in | Wt 280.0 lb

## 2017-05-22 DIAGNOSIS — R072 Precordial pain: Secondary | ICD-10-CM

## 2017-05-22 DIAGNOSIS — E785 Hyperlipidemia, unspecified: Secondary | ICD-10-CM

## 2017-05-22 NOTE — Patient Instructions (Signed)
Medication Instructions:  Continue current medications  If you need a refill on your cardiac medications before your next appointment, please call your pharmacy.  Labwork: Fasting Lipids   Testing/Procedures: None Ordered  Follow-Up: Your physician wants you to follow-up in: As needed.    Thank you for choosing [facility] at [facility]!!

## 2017-05-25 ENCOUNTER — Encounter: Payer: Self-pay | Admitting: Cardiology

## 2017-05-25 DIAGNOSIS — E785 Hyperlipidemia, unspecified: Secondary | ICD-10-CM | POA: Insufficient documentation

## 2017-05-25 DIAGNOSIS — R072 Precordial pain: Secondary | ICD-10-CM | POA: Insufficient documentation

## 2017-05-26 NOTE — Addendum Note (Signed)
Addended by: Kandice Robinsons T on: 05/26/2017 11:59 AM   Modules accepted: Orders

## 2017-09-16 DIAGNOSIS — R930 Abnormal findings on diagnostic imaging of skull and head, not elsewhere classified: Secondary | ICD-10-CM | POA: Diagnosis not present

## 2017-09-16 DIAGNOSIS — Z1289 Encounter for screening for malignant neoplasm of other sites: Secondary | ICD-10-CM | POA: Diagnosis not present

## 2017-10-19 ENCOUNTER — Ambulatory Visit: Payer: Self-pay | Admitting: *Deleted

## 2017-10-19 NOTE — Telephone Encounter (Signed)
He went to have a dental extraction done under anesthesia but his BP was 163/102 so they would not given him the anesthesia.  He was instructed to call his primary care physician for follow up. He denies any symptoms.   "I feel great".  "I'm not even nervous".  I made him an appt with Deliah BostonMichael Clark, PA-C for 10/20/17 at 8:40am at [facility].  No  Availability today with any of the providers.  I went over the s/s from the care advice with him.   I instructed him to go to the ED if he developed any of these symptoms.   He verbalized understanding.   His wife was with him on speaker phone and also verbalized understanding.   I informed him if he waned he could go to an urgent care center but since he is not having symptoms he decided to wait until the appt tomorrow morning.    Reason for Disposition . Wants doctor to measure BP  Answer Assessment - Initial Assessment Questions 1. BLOOD PRESSURE: "What is the blood pressure?" "Did you take at least two measurements 5 minutes apart?"     163/102 at the dentist's office this morning.   I was to have anesthesia for a dental extraction and my BP was too high.   They could not do the extractions. 2. ONSET: "When did you take your blood pressure?"     This morning at the dentist office. 3. HOW: "How did you obtain the blood pressure?" (e.g., visiting nurse, automatic home BP monitor)     Dentist.    Used cuff on arm. 4. HISTORY: "Do you have a history of high blood pressure?"     No history of high BP 5. MEDICATIONS: "Are you taking any medications for blood pressure?" "Have you missed any doses recently?"     Not on any.   6. OTHER SYMPTOMS: "Do you have any symptoms?" (e.g., headache, chest pain, blurred vision, difficulty breathing, weakness)     Denies any symptoms.   "I feel great" 7. PREGNANCY: "Is there any chance you are pregnant?" "When was your last menstrual period?"     N/A  Protocols used: HIGH BLOOD PRESSURE-A-AH

## 2017-10-20 ENCOUNTER — Ambulatory Visit: Payer: BLUE CROSS/BLUE SHIELD | Admitting: Physician Assistant

## 2017-10-20 ENCOUNTER — Encounter: Payer: Self-pay | Admitting: Physician Assistant

## 2017-10-20 VITALS — BP 164/108 | HR 85 | Temp 98.6°F | Resp 16 | Ht 70.0 in | Wt 278.6 lb

## 2017-10-20 DIAGNOSIS — I10 Essential (primary) hypertension: Secondary | ICD-10-CM | POA: Diagnosis not present

## 2017-10-20 LAB — BASIC METABOLIC PANEL
BUN/Creatinine Ratio: 13 (ref 9–20)
BUN: 12 mg/dL (ref 6–24)
CALCIUM: 9.4 mg/dL (ref 8.7–10.2)
CHLORIDE: 102 mmol/L (ref 96–106)
CO2: 22 mmol/L (ref 20–29)
Creatinine, Ser: 0.91 mg/dL (ref 0.76–1.27)
GFR calc non Af Amer: 97 mL/min/{1.73_m2} (ref >59)
GFR, EST AFRICAN AMERICAN: 112 mL/min/{1.73_m2} (ref >59)
Glucose: 98 mg/dL (ref 65–99)
Potassium: 4.3 mmol/L (ref 3.5–5.2)
Sodium: 140 mmol/L (ref 134–144)

## 2017-10-20 LAB — LIPID PANEL
CHOL/HDL RATIO: 3.7 ratio (ref 0.0–5.0)
Cholesterol, Total: 162 mg/dL (ref 100–199)
HDL: 44 mg/dL (ref >39)
LDL Calculated: 85 mg/dL (ref 0–99)
Triglycerides: 164 mg/dL — ABNORMAL HIGH (ref 0–149)
VLDL Cholesterol Cal: 33 mg/dL (ref 5–40)

## 2017-10-20 MED ORDER — AMLODIPINE BESYLATE 5 MG PO TABS: 5.0000 mg | ORAL_TABLET | Freq: Every day | ORAL | 3 refills | Status: DC

## 2017-10-20 MED ORDER — CHLORTHALIDONE 25 MG PO TABS: ORAL_TABLET | ORAL | 3 refills | Status: DC

## 2017-10-20 NOTE — Patient Instructions (Addendum)
I am starting you on medication for your blood pressure, I would like to see you back in one month for a recheck. If you develop any sudden side effects to the medication, please return sooner. Thank you for allowing me to care for you today.                                                                    Ofilia NeasMichael L Clark, MS PA-C   IF you received an x-ray today, you will receive an invoice from [facility]. Please contact [facility] at [phone] with questions or concerns regarding your invoice.   IF you received labwork today, you will receive an invoice from [facility]. Please contact [facility] at [phone] with questions or concerns regarding your invoice.   Our billing staff will not be able to assist you with questions regarding bills from these companies.  You will be contacted with the lab results as soon as they are available. The fastest way to get your results is to activate your My Chart account. Instructions are located on the last page of this paperwork. If you have not heard from us regarding the results in 2 weeks, please contact this office.

## 2017-10-20 NOTE — Progress Notes (Signed)
10/20/2017 9:04 AM   DOB: 08/19/1966 / MRN: [id number]  SUBJECTIVE:  Scott Yang is a 52 y.o. male presenting for elevated blood pressure on 2 separate occasions.  First occasion was measured at the dentist who advised that he be seen.  He feels well today.  BMI notable for 39.97.  Previous measures to show a history of mild hypertension.  Patient denies chest pain, shortness of breath, DOE, acute changes in vision.  Patient is notable for a 20-pack-year history.  Previous labs show prediabetes very early.  No lipid panel has been done.  Most recent EKG 05/22/2017 showing normal sinus rhythm without LVH.  He is not taking hypertensive medication.  He has No Known Allergies.   He  has no past medical history on file.    He  reports that he has quit smoking. His smoking use included cigarettes. He has a 20.00 pack-year smoking history. he has never used smokeless tobacco. He reports that he does not drink alcohol or use drugs. He  reports that he currently engages in sexual activity. The patient  has no past surgical history on file.  His family history includes Diabetes type II in his sister; Heart disease (age of onset: 3577) in his mother; Hypertension in his father and mother.  ROS  As per HPI otherwise negative  The problem list and medications were reviewed and updated by myself where necessary and exist elsewhere in the encounter.   OBJECTIVE:  BP (!) 164/108 (BP Location: Right Arm, Patient Position: Sitting, Cuff Size: Large)   Pulse 85   Temp 98.6 F (37 C) (Oral)   Resp 16   Ht 5\' 10"  (1.778 m)   Wt 278 lb 9.6 oz (126.4 kg)   SpO2 98%   BMI 39.97 kg/m   BP Readings from Last 3 Encounters:  10/20/17 (!) 164/108  05/22/17 (!) 144/90  04/29/17 (!) 148/88   Wt Readings from Last 3 Encounters:  10/20/17 278 lb 9.6 oz (126.4 kg)  05/22/17 280 lb (127 kg)  04/29/17 278 lb (126.1 kg)   Physical Exam  Constitutional: He is well-developed, well-nourished, and in no  distress. No distress.  Eyes: EOM are normal. Pupils are equal, round, and reactive to light.  Fundoscopic exam:      The right eye shows no AV nicking, no exudate, no hemorrhage and no papilledema. The right eye shows red reflex.       The left eye shows no AV nicking, no exudate, no hemorrhage and no papilledema. The left eye shows red reflex.  Cardiovascular: Normal rate, regular rhythm and normal heart sounds. Exam reveals no gallop and no friction rub.  No murmur heard. Pulmonary/Chest: Effort normal and breath sounds normal.  Musculoskeletal: Normal range of motion. He exhibits edema (Bilateral lower extremity trace. ).  Neurological: Gait normal.  Skin: He is not diaphoretic.     Lab Results  Component Value Date   WBC 5.0 04/29/2017   HGB 13.2 (A) 04/29/2017   HCT 39.5 (A) 04/29/2017   MCV 90.6 04/29/2017   PLT 213 12/28/2012    Lab Results  Component Value Date   CREATININE 0.94 04/29/2017   BUN 13 04/29/2017   NA 141 04/29/2017   K 4.4 04/29/2017   CL 106 04/29/2017   CO2 23 04/29/2017    Lab Results  Component Value Date   ALT 54 (H) 04/29/2017   AST 38 04/29/2017   ALKPHOS 91 04/29/2017   BILITOT 0.2 04/29/2017  Lab Results  Component Value Date   HGBA1C 5.8 04/29/2017    ASSESSMENT AND PLAN:  Scott Yang was seen today for hypertension.  Diagnoses and all orders for this visit:  Uncontrolled hypertension: Asymptomatic today.  Exam reassuring.  Patient has resumed his smoking.  He smokes about 1/2 pack daily and has been doing this for about 3-4 months now.  I encouraged him to quit.  Lipids have not been checked yet.  Once these are back I will have a complete picture of his ASCVD risk and we will discuss this at his next office visit in 1 month for blood pressure titration. -     Lipid panel -     Basic metabolic panel    The patient is advised to call or return to clinic if he does not see an improvement in symptoms, or to seek the care of the  closest emergency department if he worsens with the above plan.   Deliah Boston, MHS, PA-C [facility] at [facility] 10/20/2017 9:04 AM

## 2017-11-23 ENCOUNTER — Ambulatory Visit: Payer: BLUE CROSS/BLUE SHIELD | Admitting: Physician Assistant

## 2017-12-07 ENCOUNTER — Ambulatory Visit: Payer: BLUE CROSS/BLUE SHIELD | Admitting: Physician Assistant

## 2017-12-09 ENCOUNTER — Ambulatory Visit: Payer: BLUE CROSS/BLUE SHIELD | Admitting: Physician Assistant

## 2018-01-04 ENCOUNTER — Other Ambulatory Visit: Payer: Self-pay | Admitting: Physician Assistant

## 2018-01-13 ENCOUNTER — Other Ambulatory Visit: Payer: Self-pay | Admitting: Family Medicine

## 2018-01-13 NOTE — Telephone Encounter (Signed)
Rx refill request: omeprazole 40 mg  Last filled: 04/29/17 #30  LOV: 10/20/17( acute)  PCP: Chestine Spore  Pharmacy: verified

## 2018-01-22 ENCOUNTER — Encounter: Payer: Self-pay | Admitting: Physician Assistant

## 2018-01-22 ENCOUNTER — Other Ambulatory Visit: Payer: Self-pay

## 2018-01-22 ENCOUNTER — Ambulatory Visit: Payer: BLUE CROSS/BLUE SHIELD | Admitting: Physician Assistant

## 2018-01-22 VITALS — BP 130/90 | HR 83 | Temp 98.6°F | Ht 70.0 in | Wt 259.4 lb

## 2018-01-22 DIAGNOSIS — R7303 Prediabetes: Secondary | ICD-10-CM

## 2018-01-22 DIAGNOSIS — Z6837 Body mass index (BMI) 37.0-37.9, adult: Secondary | ICD-10-CM | POA: Diagnosis not present

## 2018-01-22 DIAGNOSIS — I10 Essential (primary) hypertension: Secondary | ICD-10-CM

## 2018-01-22 DIAGNOSIS — F17200 Nicotine dependence, unspecified, uncomplicated: Secondary | ICD-10-CM | POA: Insufficient documentation

## 2018-01-22 MED ORDER — CHLORTHALIDONE 25 MG PO TABS: 25.0000 mg | ORAL_TABLET | Freq: Every day | ORAL | 3 refills | Status: DC

## 2018-01-22 NOTE — Progress Notes (Signed)
01/22/2018 9:55 AM   DOB: Apr 03, 1966 / MRN: [id number]  SUBJECTIVE:  Scott Yang is a 52 y.o. male presenting for recheck HTN.  He continues smoking on occasion.  Has lost nearly 20 lbs by cutting out soda and bread.  Feels overall better.  Taking 5 norvasc along with 12.5 chlorthalidone. Has had no side effects with meds.    He has No Known Allergies.   He  has no past medical history on file.    He  reports that he has quit smoking. His smoking use included cigarettes. He has a 20.00 pack-year smoking history. He has never used smokeless tobacco. He reports that he does not drink alcohol or use drugs. He  reports that he currently engages in sexual activity. The patient  has no past surgical history on file.  His family history includes Diabetes type II in his sister; Heart disease (age of onset: 64) in his mother; Hypertension in his father and mother.  Review of Systems  Constitutional: Negative for chills, diaphoresis and fever.  Eyes: Negative.   Respiratory: Negative for cough, hemoptysis, sputum production, shortness of breath and wheezing.   Cardiovascular: Negative for chest pain, orthopnea and leg swelling.  Gastrointestinal: Negative for abdominal pain, blood in stool, constipation, diarrhea, heartburn, melena, nausea and vomiting.  Genitourinary: Negative for dysuria, flank pain, frequency, hematuria and urgency.  Skin: Negative for rash.  Neurological: Negative for dizziness, sensory change, speech change, focal weakness and headaches.    The problem list and medications were reviewed and updated by myself where necessary and exist elsewhere in the encounter.   OBJECTIVE:  BP 130/90 (BP Location: Left Arm, Patient Position: Sitting, Cuff Size: Large)   Pulse 83   Temp 98.6 F (37 C) (Oral)   Ht  (1.778 m)   Wt 259 lb 6.4 oz (117.7 kg)   SpO2 98%   BMI 37.22 kg/m   Wt Readings from Last 3 Encounters:  01/22/18 259 lb 6.4 oz (117.7 kg)  10/20/17 278  lb 9.6 oz (126.4 kg)  05/22/17 280 lb (127 kg)   Temp Readings from Last 3 Encounters:  01/22/18 98.6 F (37 C) (Oral)  10/20/17 98.6 F (37 C) (Oral)  04/29/17 98.3 F (36.8 C) (Oral)   BP Readings from Last 3 Encounters:  01/22/18 130/90  10/20/17 (!) 164/108  05/22/17 (!) 144/90   Pulse Readings from Last 3 Encounters:  01/22/18 83  10/20/17 85  05/22/17 86     Physical Exam  Constitutional: He is oriented to person, place, and time. He appears well-developed. He does not appear ill.  Eyes: Pupils are equal, round, and reactive to light. Conjunctivae and EOM are normal.  Cardiovascular: Normal rate, regular rhythm, S1 normal, S2 normal, normal heart sounds, intact distal pulses and normal pulses. Exam reveals no gallop and no friction rub.  No murmur heard. Pulmonary/Chest: Effort normal. He has no rales.  Abdominal: He exhibits no distension.  Musculoskeletal: Normal range of motion. He exhibits no edema.  Neurological: He is alert and oriented to person, place, and time. No cranial nerve deficit. Coordination normal.  Skin: Skin is warm and dry. He is not diaphoretic.  Psychiatric: He has a normal mood and affect.  Nursing note and vitals reviewed.   CMP Latest Ref Rng & Units 10/20/2017 04/29/2017 04/29/2017  Glucose 65 - 99 mg/dL 98 88 87  BUN 6 - 24 mg/dL Creatinine 0.76 - 1.27 mg/dL 4.09 8.11 9.14  Sodium 134 - 144 mmol/L 140 141 141  Potassium 3.5 - 5.2 mmol/L 4.3 4.4 4.4  Chloride 96 - 106 mmol/L 102 106 106  CO2 20 - 29 mmol/L Calcium 8.7 - 10.2 mg/dL 9.4 9.0 9.1  Total Protein 6.0 - 8.5 g/dL - 7.1 6.9  Total Bilirubin 0.0 - 1.2 mg/dL - 0.2 0.3  Alkaline Phos 39 - 117 IU/L - 91 91  AST 0 - 40 IU/L - 38 37  ALT 0 - 44 IU/L - 54(H) 52(H)   Lab Results  Component Value Date   HGBA1C 5.8 04/29/2017     No results found for this or any previous visit (from the past 72 hour(s)).  No results found.  ASSESSMENT AND PLAN:  Scott Yang was  seen today for hypertension.  Diagnoses and all orders for this visit:  Essential hypertension -     chlorthalidone (HYGROTON) 25 MG tablet; Take 1 tablet (25 mg total) by mouth daily. -     Renal Function Panel  BMI 37.0-37.9, adult Comments: Improving.   Prediabetes Comments: Patient has been losing weight. I've encouraged.   Smoker Comments: Encouraged cessation.     The patient is advised to call or return to clinic if he does not see an improvement in symptoms, or to seek the care of the closest emergency department if he worsens with the above plan.   Deliah Boston, MHS, PA-C Primary Care at [facility] 01/22/2018 9:55 AM

## 2018-01-22 NOTE — Patient Instructions (Addendum)
1. Stop smoking  2. Start the full tab of the chlorthalidone.  Continue 5 mg norvasc.  3. Come back in about about one month for annual physical. Talk to the pharmacist about a potassium supplement that you can take with your BP medication.     IF you received an x-ray today, you will receive an invoice from [facility]. Please contact [facility] at [phone] with questions or concerns regarding your invoice.   IF you received labwork today, you will receive an invoice from [place]. Please contact [facility] at [phone] with questions or concerns regarding your invoice.   Our billing staff will not be able to assist you with questions regarding bills from these companies.  You will be contacted with the lab results as soon as they are available. The fastest way to get your results is to activate your My Chart account. Instructions are located on the last page of this paperwork. If you have not heard from [place] regarding the results in 2 weeks, please contact this office.

## 2018-01-23 LAB — RENAL FUNCTION PANEL
ALBUMIN: 4.5 g/dL (ref 3.5–5.5)
BUN/Creatinine Ratio: 15 (ref 9–20)
BUN: 14 mg/dL (ref 6–24)
CALCIUM: 9.5 mg/dL (ref 8.7–10.2)
CHLORIDE: 100 mmol/L (ref 96–106)
CO2: 24 mmol/L (ref 20–29)
Creatinine, Ser: 0.94 mg/dL (ref 0.76–1.27)
GFR calc Af Amer: 108 mL/min/{1.73_m2} (ref >59)
GFR calc non Af Amer: 93 mL/min/{1.73_m2} (ref >59)
GLUCOSE: 84 mg/dL (ref 65–99)
PHOSPHORUS: 3 mg/dL (ref 2.5–4.5)
POTASSIUM: 4.2 mmol/L (ref 3.5–5.2)
Sodium: 140 mmol/L (ref 134–144)

## 2018-01-26 ENCOUNTER — Encounter: Payer: Self-pay | Admitting: *Deleted

## 2018-01-26 ENCOUNTER — Ambulatory Visit (INDEPENDENT_AMBULATORY_CARE_PROVIDER_SITE_OTHER): Payer: BLUE CROSS/BLUE SHIELD | Admitting: Physician Assistant

## 2018-01-26 ENCOUNTER — Encounter: Payer: Self-pay | Admitting: Physician Assistant

## 2018-01-26 VITALS — BP 130/80 | HR 113 | Temp 101.5°F | Resp 17 | Ht 71.0 in | Wt 254.0 lb

## 2018-01-26 DIAGNOSIS — J029 Acute pharyngitis, unspecified: Secondary | ICD-10-CM | POA: Diagnosis not present

## 2018-01-26 LAB — POCT RAPID STREP A (OFFICE): Rapid Strep A Screen: NEGATIVE

## 2018-01-26 MED ORDER — PENICILLIN G BENZATHINE 1200000 UNIT/2ML IM SUSP
1.2000 10*6.[IU] | Freq: Once | INTRAMUSCULAR | Status: AC
  Administered 2018-01-26: 1.2 10*6.[IU] via INTRAMUSCULAR

## 2018-01-26 NOTE — Progress Notes (Signed)
01/26/2018 4:28 PM   DOB: 05/10/1966 / MRN: [id number]  SUBJECTIVE:  Scott Yang is a 52 y.o. male presenting for sore throat, fever, difficulty swallowing. This started two days ago and is getting worse.  He has tried otc meds without significant relief.  He was just here for a BP check.  No sick contacts. This is a new problem. No lip, throat, tongue swelling.    He has No Known Allergies.   He  has no past medical history on file.    He  reports that he has quit smoking. His smoking use included cigarettes. He has a 20.00 pack-year smoking history. He has never used smokeless tobacco. He reports that he does not drink alcohol or use drugs. He  reports that he currently engages in sexual activity. The patient  has no past surgical history on file.  His family history includes Diabetes type II in his sister; Heart disease (age of onset: 63) in his mother; Hypertension in his father and mother.  Review of Systems  Constitutional: Negative for diaphoresis.  HENT: Negative for nosebleeds.   Respiratory: Negative for cough.   Cardiovascular: Negative for chest pain.  Skin: Negative for rash.  Neurological: Negative for dizziness.    The problem list and medications were reviewed and updated by myself where necessary and exist elsewhere in the encounter.   OBJECTIVE:  BP 130/80   Pulse (!) 113   Temp (!) 101.5 F (38.6 C) (Oral)   Resp 17   Ht  (1.803 m)   Wt 254 lb (115.2 kg)   SpO2 98%   BMI 35.43 kg/m   Physical Exam  Constitutional: He is oriented to person, place, and time. He appears well-developed. He does not appear ill.  HENT:  Mouth/Throat: Mucous membranes are not pale, not dry and not cyanotic. Tonsils are 2+ on the right. Tonsils are 2+ on the left. Tonsillar exudate.  Eyes: Pupils are equal, round, and reactive to light. Conjunctivae and EOM are normal.  Cardiovascular: Normal rate, regular rhythm, S1 normal, S2 normal, normal heart sounds, intact  distal pulses and normal pulses. Exam reveals no gallop and no friction rub.  No murmur heard. Pulmonary/Chest: Effort normal. No stridor. No respiratory distress. He has no wheezes. He has no rales.  Abdominal: He exhibits no distension.  Musculoskeletal: Normal range of motion. He exhibits no edema.  Neurological: He is alert and oriented to person, place, and time. No cranial nerve deficit. Coordination normal.  Skin: Skin is warm and dry. He is not diaphoretic.  Psychiatric: He has a normal mood and affect.  Nursing note and vitals reviewed.   Results for orders placed or performed in visit on 01/26/18 (from the past 72 hour(s))  POCT rapid strep A     Status: None   Collection Time: 01/26/18  4:17 PM  Result Value Ref Range   Rapid Strep A Screen Negative Negative    No results found.  ASSESSMENT AND PLAN:  Scott Yang was seen today for sore throat and fever.  Diagnoses and all orders for this visit:  Sore throat: New problem. He is having difficulty swallowing and opted for IM injection.  He has no history of penicillin allergy.   -     POCT rapid strep A -     Culture, Group A Strep -     penicillin g benzathine (BICILLIN LA) 1200000 UNIT/2ML injection 1.2 Million Units    The patient is advised to call  or return to clinic if he does not see an improvement in symptoms, or to seek the care of the closest emergency department if he worsens with the above plan.   Deliah Boston, MHS, PA-C Primary Care at [facility] 01/26/2018 4:28 PM

## 2018-01-26 NOTE — Patient Instructions (Addendum)
Came back in 48-72 hours if you are no improving. Otherwise I will see you back in regular follow up.     IF you received an x-ray today, you will receive an invoice from [facility]. Please contact [facility] at [phone] with questions or concerns regarding your invoice.   IF you received labwork today, you will receive an invoice from [place]. Please contact [facility] at [phone] with questions or concerns regarding your invoice.   Our billing staff will not be able to assist you with questions regarding bills from these companies.  You will be contacted with the lab results as soon as they are available. The fastest way to get your results is to activate your My Chart account. Instructions are located on the last page of this paperwork. If you have not heard from [place] regarding the results in 2 weeks, please contact this office.

## 2018-01-29 LAB — CULTURE, GROUP A STREP: STREP A CULTURE: NEGATIVE

## 2018-02-22 ENCOUNTER — Ambulatory Visit: Payer: BLUE CROSS/BLUE SHIELD | Admitting: Physician Assistant

## 2018-02-23 ENCOUNTER — Other Ambulatory Visit: Payer: Self-pay | Admitting: Physician Assistant

## 2018-02-24 NOTE — Telephone Encounter (Signed)
Refill request for ibuprofen 800 mg denied.  Medication not on pt med list.  Last ov 01/26/18.  Pt can buy otc ibuprofen if needed. Dgaddy, CMA

## 2018-05-10 ENCOUNTER — Encounter (HOSPITAL_COMMUNITY): Payer: Self-pay | Admitting: Emergency Medicine

## 2018-05-10 ENCOUNTER — Emergency Department (HOSPITAL_COMMUNITY)
Admission: EM | Admit: 2018-05-10 | Discharge: 2018-05-11 | Disposition: A | Discharge status: Home / Self Care | Payer: BLUE CROSS/BLUE SHIELD | Attending: Emergency Medicine | Admitting: Emergency Medicine

## 2018-05-10 ENCOUNTER — Emergency Department (HOSPITAL_COMMUNITY): Payer: BLUE CROSS/BLUE SHIELD

## 2018-05-10 ENCOUNTER — Other Ambulatory Visit: Payer: Self-pay

## 2018-05-10 DIAGNOSIS — Z5321 Procedure and treatment not carried out due to patient leaving prior to being seen by health care provider: Secondary | ICD-10-CM | POA: Insufficient documentation

## 2018-05-10 DIAGNOSIS — R079 Chest pain, unspecified: Secondary | ICD-10-CM | POA: Diagnosis not present

## 2018-05-10 LAB — CBC
HEMATOCRIT: 43.8 % (ref 39.0–52.0)
Hemoglobin: 14.6 g/dL (ref 13.0–17.0)
MCH: 31.3 pg (ref 26.0–34.0)
MCHC: 33.3 g/dL (ref 30.0–36.0)
MCV: 94 fL (ref 78.0–100.0)
PLATELETS: 271 10*3/uL (ref 150–400)
RBC: 4.66 MIL/uL (ref 4.22–5.81)
RDW: 12 % (ref 11.5–15.5)
WBC: 5.7 10*3/uL (ref 4.0–10.5)

## 2018-05-10 LAB — BASIC METABOLIC PANEL
Anion gap: 12 (ref 5–15)
BUN: 23 mg/dL — AB (ref 6–20)
CALCIUM: 9.7 mg/dL (ref 8.9–10.3)
CO2: 26 mmol/L (ref 22–32)
Chloride: 102 mmol/L (ref 98–111)
Creatinine, Ser: 1.74 mg/dL — ABNORMAL HIGH (ref 0.61–1.24)
GFR calc Af Amer: 51 mL/min — ABNORMAL LOW (ref >60)
GFR, EST NON AFRICAN AMERICAN: 44 mL/min — AB (ref >60)
GLUCOSE: 99 mg/dL (ref 70–99)
POTASSIUM: 3.4 mmol/L — AB (ref 3.5–5.1)
Sodium: 140 mmol/L (ref 135–145)

## 2018-05-10 LAB — I-STAT TROPONIN, ED: TROPONIN I, POC: 0.02 ng/mL (ref 0.00–0.08)

## 2018-05-10 NOTE — ED Notes (Signed)
Pt's name called for vitals no answer x1

## 2018-05-10 NOTE — ED Triage Notes (Signed)
Pt reports 3/10 left throbbing cp over the last few days and then left arm numbness that started today with dizziness.

## 2018-05-11 NOTE — ED Notes (Signed)
Called patient to reassess vitals x3 and had no answer. 

## 2018-05-14 ENCOUNTER — Encounter: Payer: Self-pay | Admitting: Urgent Care

## 2018-05-14 ENCOUNTER — Ambulatory Visit: Payer: BLUE CROSS/BLUE SHIELD | Admitting: Urgent Care

## 2018-05-14 VITALS — BP 135/81 | HR 85 | Temp 97.9°F | Resp 18 | Ht 70.0 in | Wt 254.0 lb

## 2018-05-14 DIAGNOSIS — Z6836 Body mass index (BMI) 36.0-36.9, adult: Secondary | ICD-10-CM

## 2018-05-14 DIAGNOSIS — Z7289 Other problems related to lifestyle: Secondary | ICD-10-CM

## 2018-05-14 DIAGNOSIS — E669 Obesity, unspecified: Secondary | ICD-10-CM

## 2018-05-14 DIAGNOSIS — Z789 Other specified health status: Secondary | ICD-10-CM

## 2018-05-14 DIAGNOSIS — Z87891 Personal history of nicotine dependence: Secondary | ICD-10-CM

## 2018-05-14 DIAGNOSIS — R079 Chest pain, unspecified: Secondary | ICD-10-CM | POA: Diagnosis not present

## 2018-05-14 DIAGNOSIS — R944 Abnormal results of kidney function studies: Secondary | ICD-10-CM | POA: Diagnosis not present

## 2018-05-14 DIAGNOSIS — I10 Essential (primary) hypertension: Secondary | ICD-10-CM

## 2018-05-14 DIAGNOSIS — R7303 Prediabetes: Secondary | ICD-10-CM | POA: Diagnosis not present

## 2018-05-14 LAB — BASIC METABOLIC PANEL
BUN / CREAT RATIO: 16 (ref 9–20)
BUN: 17 mg/dL (ref 6–24)
CO2: 28 mmol/L (ref 20–29)
Calcium: 9.5 mg/dL (ref 8.7–10.2)
Chloride: 99 mmol/L (ref 96–106)
Creatinine, Ser: 1.05 mg/dL (ref 0.76–1.27)
GFR calc Af Amer: 95 mL/min/{1.73_m2} (ref >59)
GFR calc non Af Amer: 82 mL/min/{1.73_m2} (ref >59)
GLUCOSE: 91 mg/dL (ref 65–99)
Potassium: 3.2 mmol/L — ABNORMAL LOW (ref 3.5–5.2)
SODIUM: 141 mmol/L (ref 134–144)

## 2018-05-14 LAB — TROPONIN I

## 2018-05-14 MED ORDER — ATORVASTATIN CALCIUM 20 MG PO TABS: 20.0000 mg | ORAL_TABLET | Freq: Every day | ORAL | 3 refills | Status: DC

## 2018-05-14 NOTE — Progress Notes (Signed)
MRN: [id number] DOB: 10/20/1965  Subjective:   Scott Yang is a 52 y.o. male presenting for 2 week history of intermittent mid-left sided sharp chest pain. Pain is transient, occurs randomly, resolves on its own. Patient works with lawn work, maintenance work, does very physically strenuous work. Symptoms became concerning when he started having tingling numbness that started at his hand, progressed to his entire left arm. Reports 1 episode of near syncope, was drinking at the time. Denies radiation of his chest pain, diaphoresis, associated n/v, abdominal pain, neck pain, jaw pain. Has a history of HTN, HL, pre-diabetes. Quit smoking 2 weeks ago, used to smoke ~1ppd. Was also drinking 12 pack of beers, 3 times per week.  Of note, patient was in the ER on 05/10/2018 for the same chest pain, this was his last episode.  He had a negative troponin at the time but decreased GFR and elevated creatinine, borderline hyperkalemia.  EKGs were very concerning as well.  Patient left due to the wait time.  He denies active chest pain since his ER visit.  Scott Yang has a current medication list which includes the following prescription(s): amlodipine, multivitamin with minerals, probiotic product, turmeric, and vitamin b-12. Also has No Known Allergies.  Scott Yang  has a past medical history of Hypertension. Denies past surgical history.   Objective:   Vitals: BP 135/81   Pulse 85   Temp 97.9 F (36.6 C) (Oral)   Resp 18   Ht 5\' 10"  (1.778 m)   Wt 254 lb (115.2 kg)   SpO2 96%   BMI 36.45 kg/m   Wt Readings from Last 3 Encounters:  05/14/18 254 lb (115.2 kg)  05/10/18 255 lb (115.7 kg)  01/26/18 254 lb (115.2 kg)    The 10-year ASCVD risk score Denman George DC Jr., et al., 2013) is: 9.9%   Values used to calculate the score:     Age: 74 years     Sex: Male     Is Non-Hispanic African American: Yes     Diabetic: No     Tobacco smoker: No     Systolic Blood Pressure: 135 mmHg     Is BP treated: Yes   HDL Cholesterol: 44 mg/dL     Total Cholesterol: 162 mg/dL   Physical Exam  Constitutional: He is oriented to person, place, and time. He appears well-developed and well-nourished. No distress.  HENT:  Mouth/Throat: Oropharynx is clear and moist.  Eyes: No scleral icterus.  Cardiovascular: Normal rate, regular rhythm, normal heart sounds and intact distal pulses. Exam reveals no gallop and no friction rub.  No murmur heard. Pulmonary/Chest: Effort normal and breath sounds normal. No stridor. No respiratory distress. He has no wheezes. He has no rales. He exhibits no tenderness.  Abdominal: Soft. Bowel sounds are normal. He exhibits no distension and no mass. There is no tenderness. There is no rebound and no guarding.  Neurological: He is alert and oriented to person, place, and time.  Skin: Skin is warm and dry. He is not diaphoretic.  Psychiatric: He has a normal mood and affect.   ECG interpretation -Q waves in lead III with T wave flattening in leads II, III, aVF, V6.  No acute changes or findings, otherwise sinus rhythm at 79bpm.  This EKG is comparable but improved from the last ecg on 05/11/2018.  Assessment and Plan :   Chest pain, unspecified type - Plan: EKG 12-Lead, Ambulatory referral to Cardiology, Troponin I  Essential hypertension - Plan: Ambulatory  referral to Cardiology, Basic metabolic panel  Prediabetes - Plan: Ambulatory referral to Cardiology  Class 2 obesity with body mass index (BMI) of 36.0 to 36.9 in adult, unspecified obesity type, unspecified whether serious comorbidity present - Plan: Ambulatory referral to Cardiology  Decreased GFR - Plan: Basic metabolic panel  Smoking history  Alcohol use  Counseled patient extensively on possibility of ACS and previous MI as shown by the Q wave on lead III.  Patient verbalized understanding including the possibility of a life-threatening heart event.  He agreed to report to the ER if his chest pain returns and this  time would stay so that he can actually get a full work-up.  Otherwise, patient will be referred urgently to cardiology.  He is to maintain his blood pressure medication, start 81 mg of aspirin daily in addition to atorvastatin.  Patient is to rest from work and not perform any strenuous physical activities until he can get his consult with cardiology.  I also recommended patient stop all alcohol and smoking.  Wallis BambergMario Rylinn Linzy, PA-C [facility] [phone] 05/14/2018  10:25 AM

## 2018-05-14 NOTE — Patient Instructions (Addendum)
Start taking an otc baby aspirin at 81mg  daily. Do not smoke cigarettes or drink alcohol anymore until your consult with a heart doctor. Start your cholesterol medication. You may take 500mg  Tylenol for pain and inflammation. Do not take any other NSAID including diclofenac, ibuprofen, naproxen, Aleve, Motrin.      Nonspecific Chest Pain Chest pain can be caused by many different conditions. There is always a chance that your pain could be related to something serious, such as a heart attack or a blood clot in your lungs. Chest pain can also be caused by conditions that are not life-threatening. If you have chest pain, it is very important to follow up with your health care provider. What are the causes? Causes of this condition include:  Heartburn.  Pneumonia or bronchitis.  Anxiety or stress.  Inflammation around your heart (pericarditis) or lung (pleuritis or pleurisy).  A blood clot in your lung.  A collapsed lung (pneumothorax). This can develop suddenly on its own (spontaneous pneumothorax) or from trauma to the chest.  Shingles infection (varicella-zoster virus).  Heart attack.  Damage to the bones, muscles, and cartilage that make up your chest wall. This can include: ? Bruised bones due to injury. ? Strained muscles or cartilage due to frequent or repeated coughing or overwork. ? Fracture to one or more ribs. ? Sore cartilage due to inflammation (costochondritis).  What increases the risk? Risk factors for this condition may include:  Activities that increase your risk for trauma or injury to your chest.  Respiratory infections or conditions that cause frequent coughing.  Medical conditions or overeating that can cause heartburn.  Heart disease or family history of heart disease.  Conditions or health behaviors that increase your risk of developing a blood clot.  Having had chicken pox (varicella zoster).  What are the signs or symptoms? Chest pain can feel  like:  Burning or tingling on the surface of your chest or deep in your chest.  Crushing, pressure, aching, or squeezing pain.  Dull or sharp pain that is worse when you move, cough, or take a deep breath.  Pain that is also felt in your back, neck, shoulder, or arm, or pain that spreads to any of these areas.  Your chest pain may come and go, or it may stay constant. How is this diagnosed? Lab tests or other studies may be needed to find the cause of your pain. Your health care provider may have you take a test called an ECG (electrocardiogram). An ECG records your heartbeat patterns at the time the test is performed. You may also have other tests, such as:  Transthoracic echocardiogram (TTE). In this test, sound waves are used to create a picture of the heart structures and to look at how blood flows through your heart.  Transesophageal echocardiogram (TEE).This is a more advanced imaging test that takes images from inside your body. It allows your health care provider to see your heart in finer detail.  Cardiac monitoring. This allows your health care provider to monitor your heart rate and rhythm in real time.  Holter monitor. This is a portable device that records your heartbeat and can help to diagnose abnormal heartbeats. It allows your health care provider to track your heart activity for several days, if needed.  Stress tests. These can be done through exercise or by taking medicine that makes your heart beat more quickly.  Blood tests.  Other imaging tests.  How is this treated? Treatment depends on what is  causing your chest pain. Treatment may include:  Medicines. These may include: ? Acid blockers for heartburn. ? Anti-inflammatory medicine. ? Pain medicine for inflammatory conditions. ? Antibiotic medicine, if an infection is present. ? Medicines to dissolve blood clots. ? Medicines to treat coronary artery disease (CAD).  Supportive care for conditions that do  not require medicines. This may include: ? Resting. ? Applying heat or cold packs to injured areas. ? Limiting activities until pain decreases.  Follow these instructions at home: Medicines  If you were prescribed an antibiotic, take it as told by your health care provider. Do not stop taking the antibiotic even if you start to feel better.  Take over-the-counter and prescription medicines only as told by your health care provider. Lifestyle  Do not use any products that contain nicotine or tobacco, such as cigarettes and e-cigarettes. If you need help quitting, ask your health care provider.  Do not drink alcohol.  Make lifestyle changes as directed by your health care provider. These may include: ? Getting regular exercise. Ask your health care provider to suggest some activities that are safe for you. ? Eating a heart-healthy diet. A registered dietitian can help you to learn healthy eating options. ? Maintaining a healthy weight. ? Managing diabetes, if necessary. ? Reducing stress, such as with yoga or relaxation techniques. General instructions  Avoid any activities that bring on chest pain.  If heartburn is the cause for your chest pain, raise (elevate) the head of your bed about 6 inches (15 cm) by putting blocks under the legs. Sleeping with more pillows does not effectively relieve heartburn because it only changes the position of your head.  Keep all follow-up visits as told by your health care provider. This is important. This includes any further testing if your chest pain does not go away. Contact a health care provider if:  Your chest pain does not go away.  You have a rash with blisters on your chest.  You have a fever.  You have chills. Get help right away if:  Your chest pain is worse.  You have a cough that gets worse, or you cough up blood.  You have severe pain in your abdomen.  You have severe weakness.  You faint.  You have sudden, unexplained  chest discomfort.  You have sudden, unexplained discomfort in your arms, back, neck, or jaw.  You have shortness of breath at any time.  You suddenly start to sweat, or your skin gets clammy.  You feel nauseous or you vomit.  You suddenly feel light-headed or dizzy.  Your heart begins to beat quickly, or it feels like it is skipping beats. These symptoms may represent a serious problem that is an emergency. Do not wait to see if the symptoms will go away. Get medical help right away. Call your local emergency services (911 in the [place] Do not drive yourself to the hospital. This information is not intended to replace advice given to you by your health care provider. Make sure you discuss any questions you have with your health care provider. Document Released: 05/28/2005 Document Revised: 05/12/2016 Document Reviewed: 05/12/2016 Elsevier Interactive Patient Education  [employer].    If you have lab work done today you will be contacted with your lab results within the next 2 weeks.  If you have not heard from [place] then please contact us. The fastest way to get your results is to register for My Chart.   IF you received an  x-ray today, you will receive an invoice from [facility]. Please contact [facility] at [phone] with questions or concerns regarding your invoice.   IF you received labwork today, you will receive an invoice from [facility]. Please contact [facility] at [phone] with questions or concerns regarding your invoice.   Our billing staff will not be able to assist you with questions regarding bills from these companies.  You will be contacted with the lab results as soon as they are available. The fastest way to get your results is to activate your My Chart account. Instructions are located on the last page of this paperwork. If you have not heard from [place] regarding the results in 2 weeks, please contact this office.

## 2018-06-02 ENCOUNTER — Telehealth: Payer: Self-pay | Admitting: *Deleted

## 2018-06-02 NOTE — Telephone Encounter (Signed)
OPEN ERROR

## 2018-06-09 ENCOUNTER — Ambulatory Visit: Payer: BLUE CROSS/BLUE SHIELD | Admitting: Cardiology

## 2018-06-13 NOTE — Progress Notes (Deleted)
Cardiology Office Note   Date:  06/13/2018   ID:  Scott Yang, DOB [birth year], MRN [id number]  PCP:  Patient, No Pcp Per  Cardiologist: Hochrein No chief complaint on file.    History of Present Illness: Scott Yang is a 52 y.o. male who presents for ongoing assessment and management of chronic chest pain.  He was last seen in the office on 05/22/2017.  It was noted that his chest pain was atypical.  He is advised on weight loss and healthy diet as primary prevention.    Past Medical History:  Diagnosis Date  . Hypertension     No past surgical history on file.   Current Outpatient Medications  Medication Sig Dispense Refill  . amLODipine (NORVASC) 5 MG tablet Take 1 tablet (5 mg total) by mouth daily. 90 tablet 3  . atorvastatin (LIPITOR) 20 MG tablet Take 1 tablet (20 mg total) by mouth daily. 90 tablet 3  . Multiple Vitamins-Minerals (MULTIVITAMIN WITH MINERALS) tablet Take 1 tablet by mouth daily.    . Probiotic Product (PROBIOTIC-10 PO) Take by mouth.    . TURMERIC PO Take by mouth.    . vitamin B-12 (CYANOCOBALAMIN) 100 MCG tablet Take 100 mcg by mouth daily.     No current facility-administered medications for this visit.     Allergies:   Patient has no known allergies.    Social History:  The patient  reports that he has quit smoking. His smoking use included cigarettes. He has a 20.00 pack-year smoking history. He has never used smokeless tobacco. He reports that he does not drink alcohol or use drugs.   Family History:  The patient's family history includes Diabetes type II in his sister; Heart disease (age of onset: 1) in his mother; Hypertension in his father and mother.    ROS: All other systems are reviewed and negative. Unless otherwise mentioned in H&P    PHYSICAL EXAM: VS:  There were no vitals taken for this visit. , BMI There is no height or weight on file to calculate BMI. GEN: Well nourished, well developed, in no acute distress HEENT:  normal Neck: no JVD, carotid bruits, or masses Cardiac: ***RRR; no murmurs, rubs, or gallops,no edema  Respiratory:  Clear to auscultation bilaterally, normal work of breathing GI: soft, nontender, nondistended, + BS MS: no deformity or atrophy Skin: warm and dry, no rash Neuro:  Strength and sensation are intact Psych: euthymic mood, full affect   EKG:  EKG {ACTION; IS/IS [id number]} ordered today. The ekg ordered today demonstrates ***   Recent Labs: 05/10/2018: Hemoglobin 14.6; Platelets 271 05/14/2018: BUN 17; Creatinine, Ser 1.05; Potassium 3.2; Sodium 141    Lipid Panel    Component Value Date/Time   CHOL 162 10/20/2017 1116   TRIG 164 (H) 10/20/2017 1116   HDL 44 10/20/2017 1116   CHOLHDL 3.7 10/20/2017 1116   LDLCALC 85 10/20/2017 1116      Wt Readings from Last 3 Encounters:  05/14/18 254 lb (115.2 kg)  05/10/18 255 lb (115.7 kg)  01/26/18 254 lb (115.2 kg)      Other studies Reviewed: Additional studies/ records that were reviewed today include: ***. Review of the above records demonstrates: ***   ASSESSMENT AND PLAN:  1.  ***   Current medicines are reviewed at length with the patient today.    Labs/ tests ordered today include: *** [name]. Liborio Nixon, ANP, AACC   06/13/2018 5:24 PM    [facility]  [facility] [address] Office [phone] Fax [phone]

## 2018-06-14 ENCOUNTER — Ambulatory Visit: Payer: BLUE CROSS/BLUE SHIELD | Admitting: Adult Health

## 2018-06-14 NOTE — Progress Notes (Deleted)
Cardiology Office Note   Date:  06/14/2018   ID:  Scott Yang, DOB February 21, 1966, MRN [id number]  PCP:  Patient, No Pcp Per  Cardiologist: Hochrein  No chief complaint on file.    History of Present Illness: Scott Yang is a 52 y.o. male who presents for ongoing assessment and management of atypical chest pain, HTN, hyperlipidemia, with other history to include pre-diabetes, obesity. He had not been seen in the office since 05/22/2017. He was seen by PCP on 05/14/2018, recent smoking cessation, he drinks heavily with 12 pack of beer, 3 times a week. He complained at that time of chest pain. He was referred back to cardiology.    Past Medical History:  Diagnosis Date  . Hypertension     No past surgical history on file.   Current Outpatient Medications  Medication Sig Dispense Refill  . amLODipine (NORVASC) 5 MG tablet Take 1 tablet (5 mg total) by mouth daily. 90 tablet 3  . atorvastatin (LIPITOR) 20 MG tablet Take 1 tablet (20 mg total) by mouth daily. 90 tablet 3  . Multiple Vitamins-Minerals (MULTIVITAMIN WITH MINERALS) tablet Take 1 tablet by mouth daily.    . Probiotic Product (PROBIOTIC-10 PO) Take by mouth.    . TURMERIC PO Take by mouth.    . vitamin B-12 (CYANOCOBALAMIN) 100 MCG tablet Take 100 mcg by mouth daily.     No current facility-administered medications for this visit.     Allergies:   Patient has no known allergies.    Social History:  The patient  reports that he has quit smoking. His smoking use included cigarettes. He has a 20.00 pack-year smoking history. He has never used smokeless tobacco. He reports that he does not drink alcohol or use drugs.   Family History:  The patient's family history includes Diabetes type II in his sister; Heart disease (age of onset: 64) in his mother; Hypertension in his father and mother.    ROS: All other systems are reviewed and negative. Unless otherwise mentioned in H&P    PHYSICAL EXAM: VS:  There were no  vitals taken for this visit. , BMI There is no height or weight on file to calculate BMI. GEN: Well nourished, well developed, in no acute distress HEENT: normal Neck: no JVD, carotid bruits, or masses Cardiac: ***RRR; no murmurs, rubs, or gallops,no edema  Respiratory:  Clear to auscultation bilaterally, normal work of breathing GI: soft, nontender, nondistended, + BS MS: no deformity or atrophy Skin: warm and dry, no rash Neuro:  Strength and sensation are intact Psych: euthymic mood, full affect   EKG:  EKG {ACTION; IS/IS [id number]} ordered today. The ekg ordered today demonstrates ***   Recent Labs: 05/10/2018: Hemoglobin 14.6; Platelets 271 05/14/2018: BUN 17; Creatinine, Ser 1.05; Potassium 3.2; Sodium 141    Lipid Panel    Component Value Date/Time   CHOL 162 10/20/2017 1116   TRIG 164 (H) 10/20/2017 1116   HDL 44 10/20/2017 1116   CHOLHDL 3.7 10/20/2017 1116   LDLCALC 85 10/20/2017 1116      Wt Readings from Last 3 Encounters:  05/14/18 254 lb (115.2 kg)  05/10/18 255 lb (115.7 kg)  01/26/18 254 lb (115.2 kg)      Other studies Reviewed: Additional studies/ records that were reviewed today include: ***. Review of the above records demonstrates: ***   ASSESSMENT AND PLAN:  1.  ***   Current medicines are reviewed at length with the patient today.    Labs/  tests ordered today include: *** [name]. Liborio Nixon, ANP, AACC   06/14/2018 11:22 AM    [facility] [address] Office [phone] Fax [phone]

## 2018-06-15 ENCOUNTER — Ambulatory Visit: Payer: BLUE CROSS/BLUE SHIELD | Admitting: Adult Health

## 2018-06-15 DIAGNOSIS — R0989 Other specified symptoms and signs involving the circulatory and respiratory systems: Secondary | ICD-10-CM

## 2018-06-16 ENCOUNTER — Encounter: Payer: Self-pay | Admitting: *Deleted

## 2018-07-09 ENCOUNTER — Ambulatory Visit: Payer: BLUE CROSS/BLUE SHIELD | Admitting: Cardiology

## 2018-07-09 NOTE — Progress Notes (Deleted)
Cardiology Office Note   Date:  07/09/2018   ID:  Scott Yang, DOB 11-Apr-1966, MRN [id number]  PCP:  Patient, No Pcp Per  Cardiologist:   Rollene Rotunda, MD  Referring:  Patient, No Pcp Per  No chief complaint on file.     History of Present Illness: Scott Yang is a 52 y.o. male who presents for evaluation of chest pain. He's had no prior cardiac history. Recently he was working in his job pouring concrete. He developed sharp pain when he was pulling back on the scraper to level the concrete.  He said that this cause some achiness in his chest that were hurt to move his arm a little bit. He was left upper discomfort. It was mild to moderate when it settled down but sharp first occurred. There are no associated symptoms. He's not had any shortness of breath, PND or orthopnea. He's not had any chest pressure, neck or arm discomfort. He's had no palpitations, presyncope or syncope. He otherwise works vigorously without bringing on any symptoms.   Of note he did see his primary provider and he had a normal troponin.   EKG demonstrated no acute changes.    PMH:  None  PSH:  None   No past surgical history on file.   Current Outpatient Medications  Medication Sig Dispense Refill  . amLODipine (NORVASC) 5 MG tablet Take 1 tablet (5 mg total) by mouth daily. 90 tablet 3  . atorvastatin (LIPITOR) 20 MG tablet Take 1 tablet (20 mg total) by mouth daily. 90 tablet 3  . Multiple Vitamins-Minerals (MULTIVITAMIN WITH MINERALS) tablet Take 1 tablet by mouth daily.    . Probiotic Product (PROBIOTIC-10 PO) Take by mouth.    . TURMERIC PO Take by mouth.    . vitamin B-12 (CYANOCOBALAMIN) 100 MCG tablet Take 100 mcg by mouth daily.     No current facility-administered medications for this visit.     Allergies:   Patient has no known allergies.    Social History:  The patient  reports that he has quit smoking. His smoking use included cigarettes. He has a 20.00 pack-year smoking  history. He has never used smokeless tobacco. He reports that he does not drink alcohol or use drugs.   Family History:  The patient's family history includes Diabetes type II in his sister; Heart disease (age of onset: 5) in his mother; Hypertension in his father and mother.    ROS:  Please see the history of present illness.   Otherwise, review of systems are positive for none.   All other systems are reviewed and negative.    PHYSICAL EXAM: VS:  There were no vitals taken for this visit. , BMI There is no height or weight on file to calculate BMI. GENERAL:  Well appearing HEENT:  Pupils equal round and reactive, fundi not visualized, oral mucosa unremarkable NECK:  No jugular venous distention, waveform within normal limits, carotid upstroke brisk and symmetric, no bruits, no thyromegaly LYMPHATICS:  No cervical, inguinal adenopathy LUNGS:  Clear to auscultation bilaterally BACK:  No CVA tenderness CHEST:  Unremarkable HEART:  PMI not displaced or sustained,S1 and S2 within normal limits, no S3, no S4, no clicks, no rubs, no murmurs ABD:  Flat, positive bowel sounds normal in frequency in pitch, no bruits, no rebound, no guarding, no midline pulsatile mass, no hepatomegaly, no splenomegaly EXT:  2 plus pulses throughout, no edema, no cyanosis no clubbing SKIN:  No rashes no nodules  NEURO:  Cranial nerves II through XII grossly intact, motor grossly intact throughout PSYCH:  Cognitively intact, oriented to person place and time    EKG:  EKG is ordered today. The ekg ordered today demonstrates sinus rhythm, rate 78, axis within normal limits, intervals within normal limits, no acute ST-T wave changes.   Recent Labs: 05/10/2018: Hemoglobin 14.6; Platelets 271 05/14/2018: BUN 17; Creatinine, Ser 1.05; Potassium 3.2; Sodium 141    Lipid Panel    Component Value Date/Time   CHOL 162 10/20/2017 1116   TRIG 164 (H) 10/20/2017 1116   HDL 44 10/20/2017 1116   CHOLHDL 3.7 10/20/2017  1116   LDLCALC 85 10/20/2017 1116          Wt Readings from Last 3 Encounters:  05/14/18 254 lb (115.2 kg)  05/10/18 255 lb (115.7 kg)  01/26/18 254 lb (115.2 kg)      Other studies Reviewed: Additional studies/ records that were reviewed today include: Labs. Review of the above records demonstrates:  Please see elsewhere in the note.     ASSESSMENT AND PLAN:  CHEST PAIN :  This is very atypical.  There is no objective evidence that this was cardiac.  It is likely musculoskeletal.  The pretest probability of obstructive coronary disease is very low. Therefore, no further cardiovascular testing is suggested.   RISK REDUCTION:  I will check a lipid profile.    OBESITY:  We had a long discussion about diet and exercise.     Current medicines are reviewed at length with the patient today.  The patient does not have concerns regarding medicines.  The following changes have been made:  no change  Labs/ tests ordered today include: None  No orders of the defined types were placed in this encounter.    Disposition:   FU with me as needed.      Signed, Rollene Rotunda, MD  07/09/2018 12:59 PM    [facility]

## 2018-08-02 ENCOUNTER — Ambulatory Visit: Payer: BLUE CROSS/BLUE SHIELD | Admitting: Emergency Medicine

## 2018-08-12 ENCOUNTER — Ambulatory Visit: Payer: BLUE CROSS/BLUE SHIELD | Admitting: Emergency Medicine

## 2018-12-08 ENCOUNTER — Other Ambulatory Visit: Payer: Self-pay | Admitting: Physician Assistant

## 2018-12-25 ENCOUNTER — Other Ambulatory Visit: Payer: Self-pay | Admitting: Family Medicine

## 2019-01-04 ENCOUNTER — Telehealth (INDEPENDENT_AMBULATORY_CARE_PROVIDER_SITE_OTHER): Payer: 59 | Admitting: Registered Nurse

## 2019-01-04 ENCOUNTER — Encounter: Payer: Self-pay | Admitting: Registered Nurse

## 2019-01-04 ENCOUNTER — Other Ambulatory Visit: Payer: Self-pay

## 2019-01-04 DIAGNOSIS — I10 Essential (primary) hypertension: Secondary | ICD-10-CM | POA: Diagnosis not present

## 2019-01-04 DIAGNOSIS — E785 Hyperlipidemia, unspecified: Secondary | ICD-10-CM

## 2019-01-04 DIAGNOSIS — Z7689 Persons encountering health services in other specified circumstances: Secondary | ICD-10-CM

## 2019-01-04 MED ORDER — AMLODIPINE BESYLATE 5 MG PO TABS: 5.0000 mg | ORAL_TABLET | Freq: Every day | ORAL | 0 refills | Status: DC

## 2019-01-04 MED ORDER — CHLORTHALIDONE 25 MG PO TABS: 25.0000 mg | ORAL_TABLET | Freq: Every day | ORAL | 0 refills | Status: DC

## 2019-01-04 MED ORDER — ATORVASTATIN CALCIUM 20 MG PO TABS: 20.0000 mg | ORAL_TABLET | Freq: Every day | ORAL | 0 refills | Status: DC

## 2019-01-04 NOTE — Progress Notes (Signed)
Telemedicine Encounter- SOAP NOTE Established Patient  This telephone encounter was conducted with the patient's (or proxy's) verbal consent via audio telecommunications: yes   Patient was instructed to have this encounter in a suitably private space; and to only have persons present to whom they give permission to participate. In addition, patient identity was confirmed by use of name plus two identifiers (DOB and address).  I discussed the limitations, risks, security and privacy concerns of performing an evaluation and management service by telephone and the availability of in person appointments. I also discussed with the patient that there may be a patient responsible charge related to this service. The patient expressed understanding and agreed to proceed.  I spent a total of 15 minutes talking with the patient or their proxy.  CC: Establish Care, Med refills  Subjective   Scott Yang is a 53 y.o. established patient. Telephone visit today to establish care with myself as PCP. Needs med refills on amlodipine, atorvastatin, and chlorthalidone  HTN: pt states it has been controlled on current regimen. Has home monitor, monitors irregularly. Will obtain in clinic measurement in 3-4 mos.   HLD: Has been well controlled with atorvastatin. Pt states he has recently started a keto-like diet, which warrants a re-check at next office visit.  Keto Diet: Patient reports that he and his wife have started a keto-like diet, which has limited his carbohydrate intake. Concern for hx of hyperlipidemia, though previously well controlled with atorvastatin. Will monitor at next OV in 3-4 mos. Pt states he has lost 10 lbs, which he feels will help with his lipids and HTN.  Pt drinks 3-4x per week, 2-3 beers per instance. Smokes 1-5 cigarettes only when drinking. Interested in quitting, not interested in pharmacological aid.     Patient Active Problem List   Diagnosis Date Noted  . Essential  hypertension 01/22/2018  . BMI 37.0-37.9, adult 01/22/2018  . Prediabetes 01/22/2018  . Smoker 01/22/2018  . Hyperlipidemia 05/25/2017    Past Medical History:  Diagnosis Date  . Hypertension     Current Outpatient Medications  Medication Sig Dispense Refill  . amLODipine (NORVASC) 5 MG tablet Take 1 tablet (5 mg total) by mouth daily. 120 tablet 0  . atorvastatin (LIPITOR) 20 MG tablet Take 1 tablet (20 mg total) by mouth daily. 120 tablet 0  . chlorthalidone (HYGROTON) 25 MG tablet Take 1 tablet (25 mg total) by mouth daily. 120 tablet 0  . Multiple Vitamins-Minerals (MULTIVITAMIN WITH MINERALS) tablet Take 1 tablet by mouth daily.    . Probiotic Product (PROBIOTIC-10 PO) Take by mouth.    . TURMERIC PO Take by mouth.    . vitamin B-12 (CYANOCOBALAMIN) 100 MCG tablet Take 100 mcg by mouth daily.     No current facility-administered medications for this visit.     No Known Allergies  Social History   Socioeconomic History  . Marital status: Married    Spouse name: Not on file  . Number of children: 5  . Years of education: Not on file  . Highest education level: Not on file  Occupational History  . Not on file  Social Needs  . Financial resource strain: Not hard at all  . Food insecurity:    Worry: Never true    Inability: Never true  . Transportation needs:    Medical: No    Non-medical: No  Tobacco Use  . Smoking status: Former Smoker    Packs/day: 1.00    Years: 20.00  Pack years: 20.00    Types: Cigarettes  . Smokeless tobacco: Never Used  . Tobacco comment: Down to 3-5 cigarettes when drinking  Substance and Sexual Activity  . Alcohol use: Yes    Alcohol/week: 3.0 standard drinks    Types: 3 Cans of beer per week  . Drug use: No  . Sexual activity: Yes    Partners: Female    Birth control/protection: Post-menopausal  Lifestyle  . Physical activity:    Days per week: 5 days    Minutes per session: 40 min  . Stress: Only a little  Relationships   . Social connections:    Talks on phone: Never    Gets together: Never    Attends religious service: Never    Active member of club or organization: No    Attends meetings of clubs or organizations: Never    Relationship status: Married  . Intimate partner violence:    Fear of current or ex partner: No    Emotionally abused: No    Physically abused: No    Forced sexual activity: No  Other Topics Concern  . Not on file  Social History Narrative   Lives with wife and step daughter.     Review of Systems  Constitutional: Negative for chills, fever and malaise/fatigue.  Eyes: Negative for blurred vision.  Respiratory: Negative for cough, shortness of breath and wheezing.   Cardiovascular: Negative for chest pain, palpitations and leg swelling.  Gastrointestinal: Negative for abdominal pain, constipation, diarrhea, heartburn, nausea and vomiting.  Neurological: Negative for dizziness and headaches.  All other systems reviewed and are negative.   Objective   Vitals as reported by the patient: There were no vitals filed for this visit.  Diagnoses and all orders for this visit:  Encounter to establish care  Essential hypertension -     amLODipine (NORVASC) 5 MG tablet; Take 1 tablet (5 mg total) by mouth daily. -     chlorthalidone (HYGROTON) 25 MG tablet; Take 1 tablet (25 mg total) by mouth daily.  Hyperlipidemia, unspecified hyperlipidemia type -     atorvastatin (LIPITOR) 20 MG tablet; Take 1 tablet (20 mg total) by mouth daily.  PLAN  Pt to be seen in 3-4 mos for CPE, labs, vitals.  Pt encouraged to contact clinic with any questions, comments, concerns in the mean time.    I discussed the assessment and treatment plan with the patient. The patient was provided an opportunity to ask questions and all were answered. The patient agreed with the plan and demonstrated an understanding of the instructions.   The patient was advised to call back or seek an in-person  evaluation if the symptoms worsen or if the condition fails to improve as anticipated.  I provided 15 minutes of non-face-to-face time during this encounter.  Janeece Agee, NP  Primary Care at [facility]

## 2019-01-04 NOTE — Progress Notes (Signed)
Pt needs to Est Care with PCP to manage chronic conditions. He needs medication refills.

## 2019-01-25 ENCOUNTER — Telehealth (INDEPENDENT_AMBULATORY_CARE_PROVIDER_SITE_OTHER): Payer: 59 | Admitting: Registered Nurse

## 2019-01-25 ENCOUNTER — Other Ambulatory Visit: Payer: Self-pay | Admitting: Registered Nurse

## 2019-01-25 ENCOUNTER — Other Ambulatory Visit: Payer: Self-pay

## 2019-01-25 VITALS — Ht 70.0 in | Wt 260.0 lb

## 2019-01-25 DIAGNOSIS — M545 Low back pain, unspecified: Secondary | ICD-10-CM

## 2019-01-25 MED ORDER — CYCLOBENZAPRINE HCL 5 MG PO TABS: 5.0000 mg | ORAL_TABLET | Freq: Three times a day (TID) | ORAL | 0 refills | Status: DC | PRN

## 2019-01-25 NOTE — Progress Notes (Signed)
Previous labs entered in error  Janeece Agee, NP

## 2019-01-25 NOTE — Progress Notes (Signed)
Chief Complaint : X 2 week lest side pain  Ppt states kidney area.  Pt state that he's bee taking Azo off and on for about to week with no real relief.

## 2019-01-25 NOTE — Progress Notes (Signed)
Telemedicine Encounter- SOAP NOTE Established Patient  This telephone encounter was conducted with the patient's (or proxy's) verbal consent via audio telecommunications: yes   Patient was instructed to have this encounter in a suitably private space; and to only have persons present to whom they give permission to participate. In addition, patient identity was confirmed by use of name plus two identifiers (DOB and address).  I discussed the limitations, risks, security and privacy concerns of performing an evaluation and management service by telephone and the availability of in person appointments. I also discussed with the patient that there may be a patient responsible charge related to this service. The patient expressed understanding and agreed to proceed.  I spent a total of 14 minutes talking with the patient or their proxy.    Subjective   Scott Yang is a 53 y.o. established patient. Telephone visit today for Lower back pain  Lower left back pain onset 1.5-2 weeks prior to visit. No radiation. Non reproducible. Dull aching quality. Denies urinary symptoms. Denies shooting pain. States it is worst in morning. Has taken azo w/o relief.   Works a very physical job with a lot of lifting. Does not recall precipitating event.     Patient Active Problem List   Diagnosis Date Noted  . Essential hypertension 01/22/2018  . BMI 37.0-37.9, adult 01/22/2018  . Prediabetes 01/22/2018  . Smoker 01/22/2018  . Hyperlipidemia 05/25/2017    Past Medical History:  Diagnosis Date  . Hypertension     Current Outpatient Medications  Medication Sig Dispense Refill  . amLODipine (NORVASC) 5 MG tablet Take 1 tablet (5 mg total) by mouth daily. 120 tablet 0  . atorvastatin (LIPITOR) 20 MG tablet Take 1 tablet (20 mg total) by mouth daily. 120 tablet 0  . chlorthalidone (HYGROTON) 25 MG tablet Take 1 tablet (25 mg total) by mouth daily. 120 tablet 0  . Multiple Vitamins-Minerals  (MULTIVITAMIN WITH MINERALS) tablet Take 1 tablet by mouth daily.    . Probiotic Product (PROBIOTIC-10 PO) Take by mouth.    . TURMERIC PO Take by mouth.    . cyclobenzaprine (FLEXERIL) 5 MG tablet Take 1 tablet (5 mg total) by mouth 3 (three) times daily as needed for muscle spasms. 30 tablet 0  . vitamin B-12 (CYANOCOBALAMIN) 100 MCG tablet Take 100 mcg by mouth daily.     No current facility-administered medications for this visit.     No Known Allergies  Social History   Socioeconomic History  . Marital status: Married    Spouse name: Not on file  . Number of children: 5  . Years of education: Not on file  . Highest education level: Not on file  Occupational History  . Not on file  Social Needs  . Financial resource strain: Not hard at all  . Food insecurity:    Worry: Never true    Inability: Never true  . Transportation needs:    Medical: No    Non-medical: No  Tobacco Use  . Smoking status: Former Smoker    Packs/day: 1.00    Years: 20.00    Pack years: 20.00    Types: Cigarettes  . Smokeless tobacco: Never Used  . Tobacco comment: Down to 3-5 cigarettes when drinking  Substance and Sexual Activity  . Alcohol use: Yes    Alcohol/week: 3.0 standard drinks    Types: 3 Cans of beer per week  . Drug use: No  . Sexual activity: Yes  Partners: Female    Birth control/protection: Post-menopausal  Lifestyle  . Physical activity:    Days per week: 5 days    Minutes per session: 40 min  . Stress: Only a little  Relationships  . Social connections:    Talks on phone: Never    Gets together: Never    Attends religious service: Never    Active member of club or organization: No    Attends meetings of clubs or organizations: Never    Relationship status: Married  . Intimate partner violence:    Fear of current or ex partner: No    Emotionally abused: No    Physically abused: No    Forced sexual activity: No  Other Topics Concern  . Not on file  Social  History Narrative   Lives with wife and step daughter.     Review of Systems  Constitutional: Negative.  Negative for chills, fever and malaise/fatigue.  HENT: Negative.   Eyes: Negative.   Respiratory: Negative.   Cardiovascular: Negative.   Gastrointestinal: Negative.   Genitourinary: Negative.   Musculoskeletal: Positive for back pain and myalgias. Negative for falls, joint pain and neck pain.       Per hpi  Skin: Negative.   Neurological: Negative.   Endo/Heme/Allergies: Negative.   Psychiatric/Behavioral: Negative.     Objective   Vitals as reported by the patient: Today's Vitals   01/25/19 0933  Weight: 260 lb (117.9 kg)  Height: 5\' 10"  (1.778 m)    Diagnoses and all orders for this visit:  Acute left-sided low back pain without sciatica -     Urinalysis -     Urine Culture; Future -     cyclobenzaprine (FLEXERIL) 5 MG tablet; Take 1 tablet (5 mg total) by mouth 3 (three) times daily as needed for muscle spasms. -     Ambulatory referral to Physical Therapy   PLAN:  Urinalysis and culture to r/o urinary etiology  Suspect MSK etiology - flexeril 5mg  PO tid PRN for pain, discussed taking this largely at night and not when driving/operating heavy machinery.   Referral to PT for eval / stretching / strengthening, pt acknowledges he does not lift properly all the time, could benefit from consult.  Encouraged to call clinic if symptoms worsen or fail to improve  Patient encouraged to call clinic with any questions, comments, or concerns.    I discussed the assessment and treatment plan with the patient. The patient was provided an opportunity to ask questions and all were answered. The patient agreed with the plan and demonstrated an understanding of the instructions.   The patient was advised to call back or seek an in-person evaluation if the symptoms worsen or if the condition fails to improve as anticipated.  I provided 14 minutes of non-face-to-face time  during this encounter.  Janeece Agee, NP  Primary Care at [facility]

## 2019-01-28 ENCOUNTER — Other Ambulatory Visit: Payer: Self-pay

## 2019-01-28 ENCOUNTER — Ambulatory Visit (INDEPENDENT_AMBULATORY_CARE_PROVIDER_SITE_OTHER): Payer: 59 | Admitting: Registered Nurse

## 2019-01-28 DIAGNOSIS — M545 Low back pain, unspecified: Secondary | ICD-10-CM

## 2019-01-29 LAB — URINALYSIS
Bilirubin, UA: NEGATIVE
Glucose, UA: NEGATIVE
Ketones, UA: NEGATIVE
Leukocytes,UA: NEGATIVE
Nitrite, UA: NEGATIVE
Protein,UA: NEGATIVE
RBC, UA: NEGATIVE
Specific Gravity, UA: 1.023 (ref 1.005–1.030)
Urobilinogen, Ur: 0.2 mg/dL (ref 0.2–1.0)
pH, UA: 8 — ABNORMAL HIGH (ref 5.0–7.5)

## 2019-01-29 LAB — URINE CULTURE: Organism ID, Bacteria: NO GROWTH

## 2019-03-31 ENCOUNTER — Other Ambulatory Visit: Payer: Self-pay | Admitting: Registered Nurse

## 2019-03-31 DIAGNOSIS — I10 Essential (primary) hypertension: Secondary | ICD-10-CM

## 2019-03-31 DIAGNOSIS — E785 Hyperlipidemia, unspecified: Secondary | ICD-10-CM

## 2019-04-02 ENCOUNTER — Other Ambulatory Visit: Payer: Self-pay | Admitting: Physician Assistant

## 2019-04-02 DIAGNOSIS — I10 Essential (primary) hypertension: Secondary | ICD-10-CM

## 2019-04-12 ENCOUNTER — Other Ambulatory Visit: Payer: Self-pay | Admitting: Registered Nurse

## 2019-04-12 DIAGNOSIS — I10 Essential (primary) hypertension: Secondary | ICD-10-CM

## 2019-04-14 ENCOUNTER — Other Ambulatory Visit: Payer: Self-pay | Admitting: Registered Nurse

## 2019-04-14 DIAGNOSIS — I10 Essential (primary) hypertension: Secondary | ICD-10-CM

## 2019-06-20 ENCOUNTER — Other Ambulatory Visit: Payer: Self-pay | Admitting: Registered Nurse

## 2019-06-20 DIAGNOSIS — E785 Hyperlipidemia, unspecified: Secondary | ICD-10-CM

## 2019-06-29 ENCOUNTER — Other Ambulatory Visit: Payer: Self-pay | Admitting: Registered Nurse

## 2019-06-29 DIAGNOSIS — I10 Essential (primary) hypertension: Secondary | ICD-10-CM

## 2019-06-29 DIAGNOSIS — E785 Hyperlipidemia, unspecified: Secondary | ICD-10-CM

## 2019-08-02 ENCOUNTER — Ambulatory Visit: Payer: 59 | Admitting: Registered Nurse

## 2019-08-03 ENCOUNTER — Encounter: Payer: Self-pay | Admitting: Registered Nurse

## 2019-08-04 ENCOUNTER — Other Ambulatory Visit: Payer: Self-pay | Admitting: *Deleted

## 2019-08-04 DIAGNOSIS — I10 Essential (primary) hypertension: Secondary | ICD-10-CM

## 2019-08-04 MED ORDER — CHLORTHALIDONE 25 MG PO TABS: ORAL_TABLET | ORAL | 0 refills | Status: DC

## 2019-09-09 ENCOUNTER — Other Ambulatory Visit: Payer: Self-pay

## 2019-09-09 ENCOUNTER — Encounter: Payer: Self-pay | Admitting: Registered Nurse

## 2019-09-09 ENCOUNTER — Ambulatory Visit (INDEPENDENT_AMBULATORY_CARE_PROVIDER_SITE_OTHER): Payer: 59 | Admitting: Registered Nurse

## 2019-09-09 VITALS — BP 151/93 | HR 100 | Temp 97.7°F | Ht 70.0 in | Wt 257.8 lb

## 2019-09-09 DIAGNOSIS — E785 Hyperlipidemia, unspecified: Secondary | ICD-10-CM

## 2019-09-09 DIAGNOSIS — Z23 Encounter for immunization: Secondary | ICD-10-CM | POA: Diagnosis not present

## 2019-09-09 DIAGNOSIS — I10 Essential (primary) hypertension: Secondary | ICD-10-CM | POA: Diagnosis not present

## 2019-09-09 DIAGNOSIS — Z1211 Encounter for screening for malignant neoplasm of colon: Secondary | ICD-10-CM

## 2019-09-09 DIAGNOSIS — M545 Low back pain, unspecified: Secondary | ICD-10-CM

## 2019-09-09 MED ORDER — AMLODIPINE BESYLATE 5 MG PO TABS: 5.0000 mg | ORAL_TABLET | Freq: Every day | ORAL | 1 refills | Status: DC

## 2019-09-09 MED ORDER — CHLORTHALIDONE 25 MG PO TABS: ORAL_TABLET | ORAL | 1 refills | Status: DC

## 2019-09-09 MED ORDER — ATORVASTATIN CALCIUM 20 MG PO TABS: 20.0000 mg | ORAL_TABLET | Freq: Every day | ORAL | 1 refills | Status: DC

## 2019-09-09 MED ORDER — CYCLOBENZAPRINE HCL 5 MG PO TABS: 5.0000 mg | ORAL_TABLET | Freq: Three times a day (TID) | ORAL | 1 refills | Status: DC | PRN

## 2019-09-09 MED ORDER — MELOXICAM 7.5 MG PO TABS: 7.5000 mg | ORAL_TABLET | Freq: Every day | ORAL | 0 refills | Status: DC

## 2019-09-09 NOTE — Progress Notes (Signed)
Acute Office Visit  Subjective:    Patient ID: Scott Yang, male    DOB: 11/26/1965, 54 y.o.   MRN: [id number]  Chief Complaint  Patient presents with  . Back Pain    left lower back pain was bending over working 12 days ago and started to feel pain , also that side some times goes numb and also left hand sometimes get numb as well.    HPI Patient is in today for lower left back pain. 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 Finished job around 12 days ago, lower left back still sore. Notes that the pain does not radiate upwards, medially, or downwards. No sciatica. Some limited ROM d/t pain. Sometimes feels numb at night. Tight in morning. No dysuria or hematuria.   Past Medical History:  Diagnosis Date  . Hypertension     History reviewed. No pertinent surgical history.  Family History  Problem Relation Age of Onset  . Hypertension Mother   . Heart disease Mother 91       Pacemaker  . Hypertension Father   . Diabetes type II Sister     Social History   Socioeconomic History  . Marital status: Married    Spouse name: Not on file  . Number of children: 5  . Years of education: Not on file  . Highest education level: Not on file  Occupational History  . Not on file  Tobacco Use  . Smoking status: Former Smoker    Packs/day: 1.00    Years: 20.00    Pack years: 20.00    Types: Cigarettes  . Smokeless tobacco: Never Used  . Tobacco comment: Down to 3-5 cigarettes when drinking  Substance and Sexual Activity  . Alcohol use: Yes    Alcohol/week: 3.0 standard drinks    Types: 3 Cans of beer per week  . Drug use: No  . Sexual activity: Yes    Partners: Female    Birth control/protection: Post-menopausal  Other Topics Concern  . Not on file  Social History Narrative   Lives with wife and step daughter.    Social Determinants of Health   Financial Resource Strain: Low Risk   .  Difficulty of Paying Living Expenses: Not hard at all  Food Insecurity: No Food Insecurity  . Worried About Programme researcher, broadcasting/film/video in the Last Year: Never true  . Ran Out of Food in the Last Year: Never true  Transportation Needs: No Transportation Needs  . Lack of Transportation (Medical): No  . Lack of Transportation (Non-Medical): No  Physical Activity: Sufficiently Active  . Days of Exercise per Week: 5 days  . Minutes of Exercise per Session: 40 min  Stress: No Stress Concern Present  . Feeling of Stress : Only a little  Social Connections: Moderately Isolated  . Frequency of Communication with Friends and Family: Never  . Frequency of Social Gatherings with Friends and Family: Never  . Attends Religious Services: Never  . Active Member of Clubs or Organizations: No  . Attends Banker Meetings: Never  . Marital Status: Married  Catering manager Violence: Not At Risk  . Fear of Current or Ex-Partner: No  . Emotionally Abused: No  . Physically Abused: No  . Sexually Abused: No    Outpatient Medications Prior to Visit  Medication Sig Dispense Refill  . amLODipine (NORVASC) 5 MG tablet TAKE 1  TABLET BY MOUTH DAILY 120 tablet 0  . atorvastatin (LIPITOR) 20 MG tablet TAKE 1 TABLET BY MOUTH DAILY 120 tablet 0  . chlorthalidone (HYGROTON) 25 MG tablet TAKE 1 TABLET(25 MG) BY MOUTH DAILY 90 tablet 0  . Multiple Vitamins-Minerals (MULTIVITAMIN WITH MINERALS) tablet Take 1 tablet by mouth daily.    . Probiotic Product (PROBIOTIC-10 PO) Take by mouth.    . TURMERIC PO Take by mouth.    . vitamin B-12 (CYANOCOBALAMIN) 100 MCG tablet Take 100 mcg by mouth daily.    . cyclobenzaprine (FLEXERIL) 5 MG tablet Take 1 tablet (5 mg total) by mouth 3 (three) times daily as needed for muscle spasms. (Patient not taking: Reported on 09/09/2019) 30 tablet 0   No facility-administered medications prior to visit.    No Known Allergies  Review of Systems  Constitutional: Negative.     HENT: Negative.   Eyes: Negative.   Respiratory: Negative.   Cardiovascular: Negative.   Gastrointestinal: Negative.   Endocrine: Negative.   Genitourinary: Negative.   Musculoskeletal: Positive for back pain and myalgias (lower left back). Negative for arthralgias, gait problem, joint swelling, neck pain and neck stiffness.  Skin: Negative.   Allergic/Immunologic: Negative.   Neurological: Negative.   Hematological: Negative.   Psychiatric/Behavioral: Negative.   All other systems reviewed and are negative.      Objective:    Physical Exam Vitals and nursing note reviewed.  Constitutional:      General: He is not in acute distress.    Appearance: Normal appearance. He is normal weight. He is not ill-appearing, toxic-appearing or diaphoretic.  Musculoskeletal:        General: Tenderness (lower left back) present. No swelling.  Skin:    General: Skin is warm and dry.     Capillary Refill: Capillary refill takes less than 2 seconds.     Coloration: Skin is not jaundiced or pale.     Findings: No bruising, erythema, lesion or rash.  Neurological:     General: No focal deficit present.     Mental Status: He is alert and oriented to person, place, and time. Mental status is at baseline.     Cranial Nerves: No cranial nerve deficit.  Psychiatric:        Mood and Affect: Mood normal.        Behavior: Behavior normal.        Thought Content: Thought content normal.        Judgment: Judgment normal.     BP (!) 151/93   Pulse 100   Temp 97.7 F (36.5 C) (Temporal)   Ht 5\' 10"  (1.778 m)   Wt 257 lb 12.8 oz (116.9 kg)   SpO2 93%   BMI 36.99 kg/m  Wt Readings from Last 3 Encounters:  09/09/19 257 lb 12.8 oz (116.9 kg)  01/25/19 260 lb (117.9 kg)  05/14/18 254 lb (115.2 kg)    Health Maintenance Due  Topic Date Due  . TETANUS/TDAP  06/02/1985  . COLONOSCOPY  06/02/2016    There are no preventive care reminders to display for this patient.   No results found for:  TSH Lab Results  Component Value Date   WBC 5.7 05/10/2018   HGB 14.6 05/10/2018   HCT 43.8 05/10/2018   MCV 94.0 05/10/2018   PLT 271 05/10/2018   Lab Results  Component Value Date   NA 141 05/14/2018   K 3.2 (L) 05/14/2018   CO2 28 05/14/2018   GLUCOSE 91 05/14/2018  BUN 17 05/14/2018   CREATININE 1.05 05/14/2018   BILITOT 0.2 04/29/2017   ALKPHOS 91 04/29/2017   AST 38 04/29/2017   ALT 54 (H) 04/29/2017   PROT 7.1 04/29/2017   ALBUMIN 4.5 01/22/2018   CALCIUM 9.5 05/14/2018   ANIONGAP 12 05/10/2018   Lab Results  Component Value Date   CHOL 162 10/20/2017   Lab Results  Component Value Date   HDL 44 10/20/2017   Lab Results  Component Value Date   LDLCALC 85 10/20/2017   Lab Results  Component Value Date   TRIG 164 (H) 10/20/2017   Lab Results  Component Value Date   CHOLHDL 3.7 10/20/2017   Lab Results  Component Value Date   HGBA1C 5.8 04/29/2017       Assessment & Plan:   Problem List Items Addressed This Visit    None    Visit Diagnoses    Need for prophylactic vaccination with combined diphtheria-tetanus-pertussis (DTP) vaccine    -  Primary   Relevant Orders   Tdap vaccine greater than or equal to 7yo IM   Screening for colon cancer       Relevant Orders   Ambulatory referral to Gastroenterology   Acute left-sided low back pain without sciatica       Relevant Medications   meloxicam (MOBIC) 7.5 MG tablet   cyclobenzaprine (FLEXERIL) 5 MG tablet       Meds ordered this encounter  Medications  . meloxicam (MOBIC) 7.5 MG tablet    Sig: Take 1 tablet (7.5 mg total) by mouth daily.    Dispense:  30 tablet    Refill:  0    Order Specific Question:   Supervising Provider    Answer:   Collie Siad A [id number]  . cyclobenzaprine (FLEXERIL) 5 MG tablet    Sig: Take 1 tablet (5 mg total) by mouth 3 (three) times daily as needed for muscle spasms (May be sedating, take after work.).    Dispense:  30 tablet    Refill:  1    Order  Specific Question:   Supervising Provider    Answer:   Collie Siad A [id number]   PLAN  Lower left back strain. Do not feel there is renal involvement based on symptoms, history, and exam.   Will treat with meloxicam and flexeril. Discussed risks and benefits of these medications. Suggest PT, at least stretching multiple times daily.   Pt will return in May for CPE and labs.  Patient encouraged to call clinic with any questions, comments, or concerns.   Janeece Agee, NP

## 2019-09-09 NOTE — Patient Instructions (Signed)
° ° ° °  If you have lab work done today you will be contacted with your lab results within the next 2 weeks.  If you have not heard from us then please contact us. The fastest way to get your results is to register for My Chart. ° ° °IF you received an x-ray today, you will receive an invoice from [facility]. Please contact [facility] at [phone] with questions or concerns regarding your invoice.  ° °IF you received labwork today, you will receive an invoice from [facility]. Please contact [facility] at [phone] with questions or concerns regarding your invoice.  ° °Our billing staff will not be able to assist you with questions regarding bills from these companies. ° °You will be contacted with the lab results as soon as they are available. The fastest way to get your results is to activate your My Chart account. Instructions are located on the last page of this paperwork. If you have not heard from us regarding the results in 2 weeks, please contact this office. °  ° ° ° °

## 2019-10-10 ENCOUNTER — Encounter: Payer: Self-pay | Admitting: Internal Medicine

## 2019-11-10 ENCOUNTER — Other Ambulatory Visit: Payer: Self-pay | Admitting: Registered Nurse

## 2019-11-10 DIAGNOSIS — I10 Essential (primary) hypertension: Secondary | ICD-10-CM

## 2019-11-10 NOTE — Telephone Encounter (Signed)
Copied from CRM [id number]. Topic: Quick Communication - Rx Refill/Question >> Nov 10, 2019  1:11 PM [place]onia Small, [place] A wrote: Medication: chlorthalidone (HYGROTON) 25 MG tablet (Patient is completely out of medication and would like his request expedited.)  Has the patient contacted their pharmacy? Yes (Agent: If no, request that the patient contact the pharmacy for the refill.) (Agent: If yes, when and what did the pharmacy advise?)Contact PCP  Preferred Pharmacy (with phone number or street name): [facility] [phone] - [place], [place] - [address]  Phone:  [phone] Fax:  [phone]     Agent: Please be advised that RX refills may take up to 3 business days. We ask that you follow-up with your pharmacy.

## 2019-11-10 NOTE — Telephone Encounter (Signed)
Requesting refill too early. Current order should last until July'21.

## 2019-11-11 ENCOUNTER — Encounter: Payer: Self-pay | Admitting: Internal Medicine

## 2019-11-21 ENCOUNTER — Encounter: Payer: BLUE CROSS/BLUE SHIELD | Admitting: Internal Medicine

## 2019-12-05 ENCOUNTER — Telehealth: Payer: Self-pay

## 2019-12-05 NOTE — Telephone Encounter (Signed)
1140 am-pt is 10 minutes late for previsit, call to pt, lm on vm to call by 5pm or pv and colonoscopy will be canceled.

## 2019-12-15 ENCOUNTER — Encounter: Payer: 59 | Admitting: Internal Medicine

## 2020-01-06 ENCOUNTER — Encounter: Payer: 59 | Admitting: Registered Nurse

## 2020-01-09 ENCOUNTER — Encounter: Payer: Self-pay | Admitting: Registered Nurse

## 2020-03-08 ENCOUNTER — Other Ambulatory Visit: Payer: Self-pay | Admitting: Registered Nurse

## 2020-03-08 DIAGNOSIS — E785 Hyperlipidemia, unspecified: Secondary | ICD-10-CM

## 2020-03-08 NOTE — Telephone Encounter (Signed)
Please schedule f/u appt for any further refills °

## 2020-03-08 NOTE — Telephone Encounter (Signed)
No further refills without office visit 

## 2020-03-09 NOTE — Telephone Encounter (Signed)
03/09/2020 - PATIENT REQUESTED A REFILL ON HIS LIPITOR 20 MG. HE WAS GIVEN A 30 DAY COURTESY SUPPLY. NO MORE REFILLS UNTIL HE HAS AN OFFICE VISIT WITH RICH MORROW. I HAVE CALLED AND SCHEDULED THE PATIENT TO SEE RICH ON Monday 03/12/2020 AT 10:10 am. [facility]

## 2020-03-12 ENCOUNTER — Ambulatory Visit: Payer: 59 | Admitting: Registered Nurse

## 2020-03-13 ENCOUNTER — Encounter: Payer: Self-pay | Admitting: Registered Nurse

## 2020-03-26 ENCOUNTER — Encounter: Payer: Self-pay | Admitting: Registered Nurse

## 2020-04-03 ENCOUNTER — Other Ambulatory Visit: Payer: Self-pay | Admitting: Registered Nurse

## 2020-04-03 DIAGNOSIS — I10 Essential (primary) hypertension: Secondary | ICD-10-CM

## 2020-04-03 DIAGNOSIS — E785 Hyperlipidemia, unspecified: Secondary | ICD-10-CM

## 2020-04-03 MED ORDER — ATORVASTATIN CALCIUM 20 MG PO TABS: ORAL_TABLET | ORAL | 0 refills | Status: DC

## 2020-04-03 MED ORDER — AMLODIPINE BESYLATE 5 MG PO TABS: 5.0000 mg | ORAL_TABLET | Freq: Every day | ORAL | 0 refills | Status: DC

## 2020-04-03 NOTE — Telephone Encounter (Signed)
Pt. Called checking in on request. Pt. Has scheduled an appt. With Mr. Scott Yang for next week on Monday

## 2020-04-03 NOTE — Telephone Encounter (Signed)
Requested medication (s) are due for refill today: yes  Requested medication (s) are on the active medication list: yes  Last refill:  atorvastatin 03/08/20 courtesy refill,  amlodipine 09/09/19  #90 1 refill  Future visit scheduled: Scheduled  Notes to clinic:  Note states no further refills until appointment. Last courtesy done and then No show    Requested Prescriptions  Pending Prescriptions Disp Refills   atorvastatin (LIPITOR) 20 MG tablet 30 tablet 0      Cardiovascular:  Antilipid - Statins Failed - 04/03/2020  2:14 PM      Failed - Total Cholesterol in normal range and within 360 days    Cholesterol, Total  Date Value Ref Range Status  10/20/2017 162 100 - 199 mg/dL Final          Failed - LDL in normal range and within 360 days    LDL Calculated  Date Value Ref Range Status  10/20/2017 85 0 - 99 mg/dL Final          Failed - HDL in normal range and within 360 days    HDL  Date Value Ref Range Status  10/20/2017 44 >39 mg/dL Final          Failed - Triglycerides in normal range and within 360 days    Triglycerides  Date Value Ref Range Status  10/20/2017 164 (H) 0 - 149 mg/dL Final          Passed - Patient is not pregnant      Passed - Valid encounter within last 12 months    Recent Outpatient Visits           6 months ago Need for prophylactic vaccination with combined diphtheria-tetanus-pertussis (DTP) vaccine   [facility], Richard, NP   1 year ago Acute left-sided low back pain without sciatica   [facility], Gerlene Burdock, NP   1 year ago Encounter to establish care   [facility], Gerlene Burdock, NP   1 year ago Chest pain, unspecified type   Primary Care at [facility], [place], [place]   2 years ago Sore throat   [facility], Marolyn Hammock, PA-C       Future Appointments             In 6 days Janeece Agee, NP [facility] at [place], [facility]              amLODipine (NORVASC) 5 MG  tablet 90 tablet 1    Sig: Take 1 tablet (5 mg total) by mouth daily.      Cardiovascular:  Calcium Channel Blockers Failed - 04/03/2020  2:14 PM      Failed - Last BP in normal range    BP Readings from Last 1 Encounters:  09/09/19 (!) 151/93          Failed - Valid encounter within last 6 months    Recent Outpatient Visits           6 months ago Need for prophylactic vaccination with combined diphtheria-tetanus-pertussis (DTP) vaccine   [facility], Richard, NP   1 year ago Acute left-sided low back pain without sciatica   [facility], Gerlene Burdock, NP   1 year ago Encounter to establish care   [facility], Gerlene Burdock, NP   1 year ago Chest pain, unspecified type   Primary Care at [facility], [place], [place]  2 years ago Sore throat   [facility], Marolyn Hammock, PA-C       Future Appointments             In 6 days Janeece Agee, NP Primary Care at [place], [facility]

## 2020-04-03 NOTE — Telephone Encounter (Signed)
atorvastatin (LIPITOR) 20 MG tablet amLODipine (NORVASC) 5 MG tablet     Patient is requesting refill. Patient is out of medication.    Pharmacy:  [id number] - [place], [place] - [address] AT [address] Phone:  [phone]  Fax:  [phone]

## 2020-04-09 ENCOUNTER — Ambulatory Visit: Payer: 59 | Admitting: Registered Nurse

## 2020-04-10 ENCOUNTER — Encounter: Payer: Self-pay | Admitting: Registered Nurse

## 2020-04-28 ENCOUNTER — Other Ambulatory Visit: Payer: Self-pay | Admitting: Registered Nurse

## 2020-04-28 DIAGNOSIS — I10 Essential (primary) hypertension: Secondary | ICD-10-CM

## 2020-06-11 IMAGING — DX DG CHEST 2V
2 series · 2 of 2 positions shown · non-contrast
Comparison: 05/02/2012

CLINICAL DATA: Intermittent left side chest pain

EXAM:
CHEST - 2 VIEW

[w chest pa]
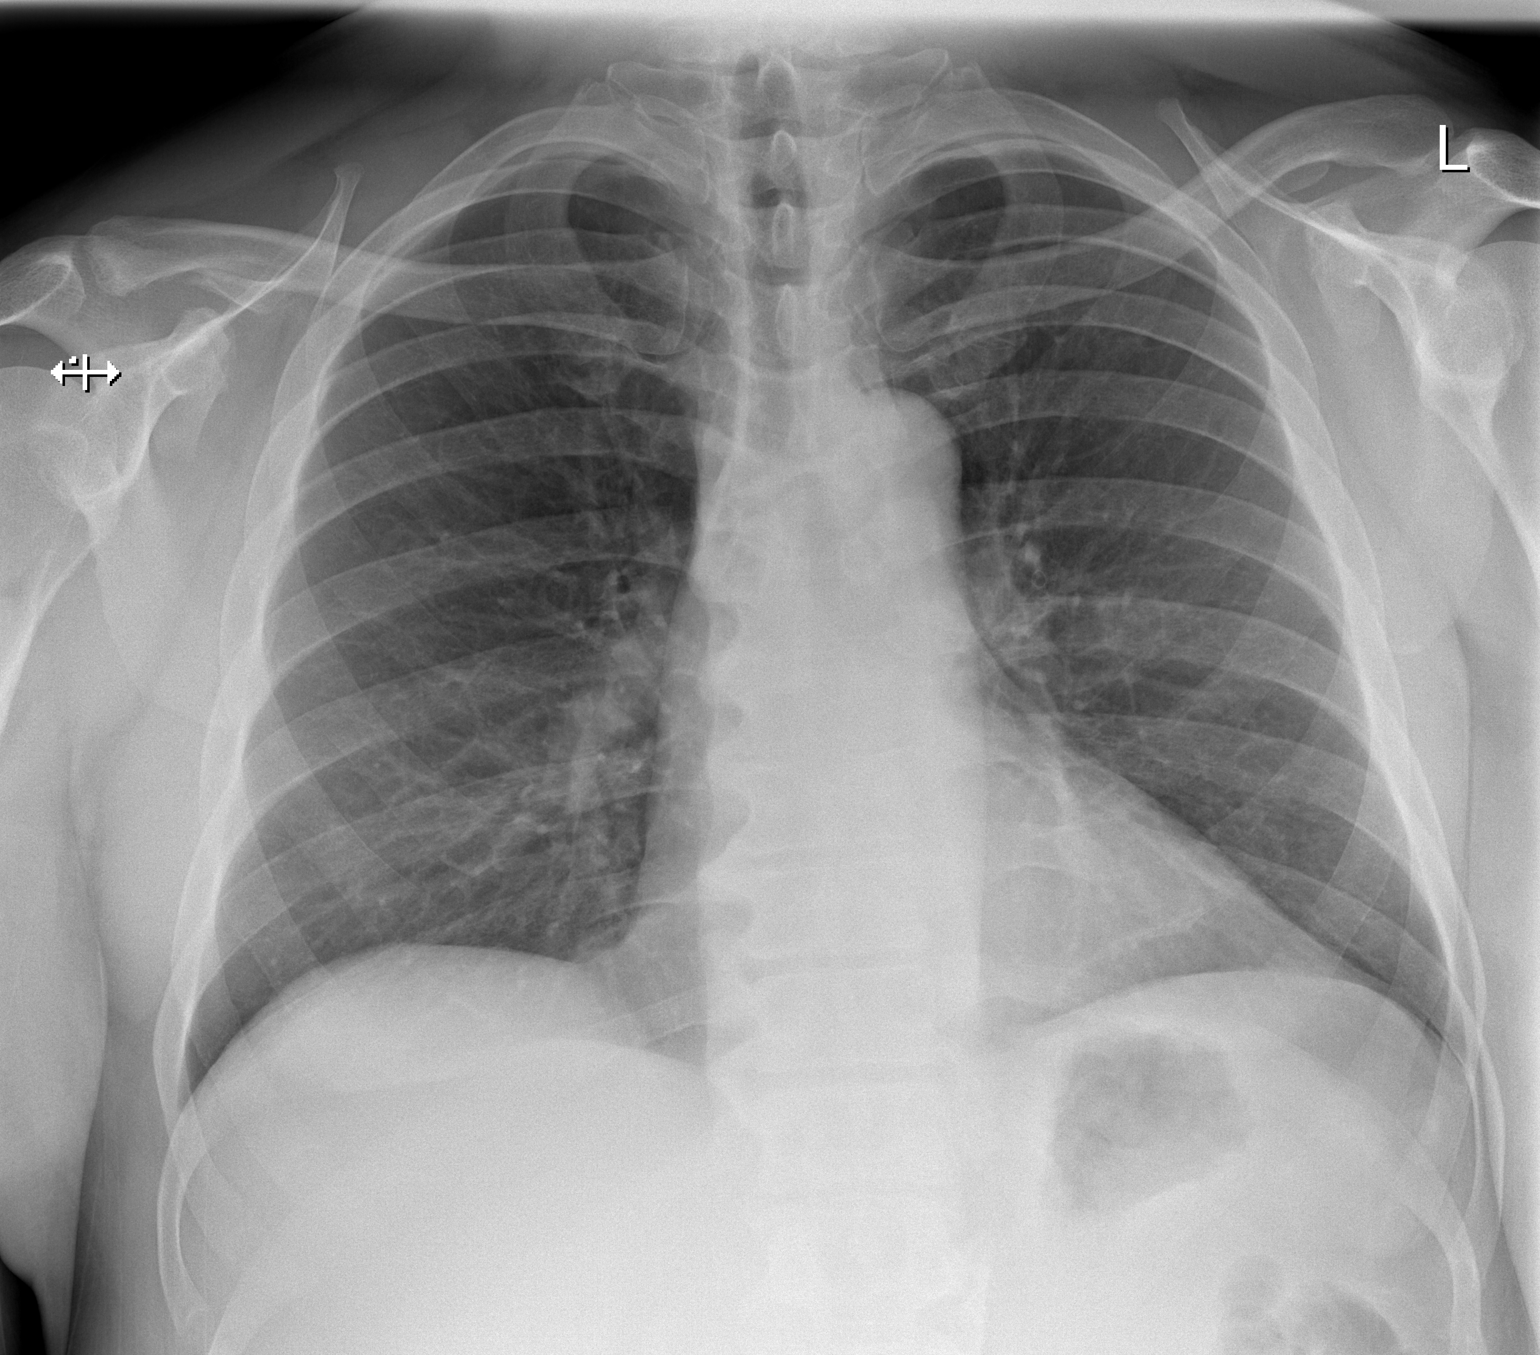

[w chest lat]
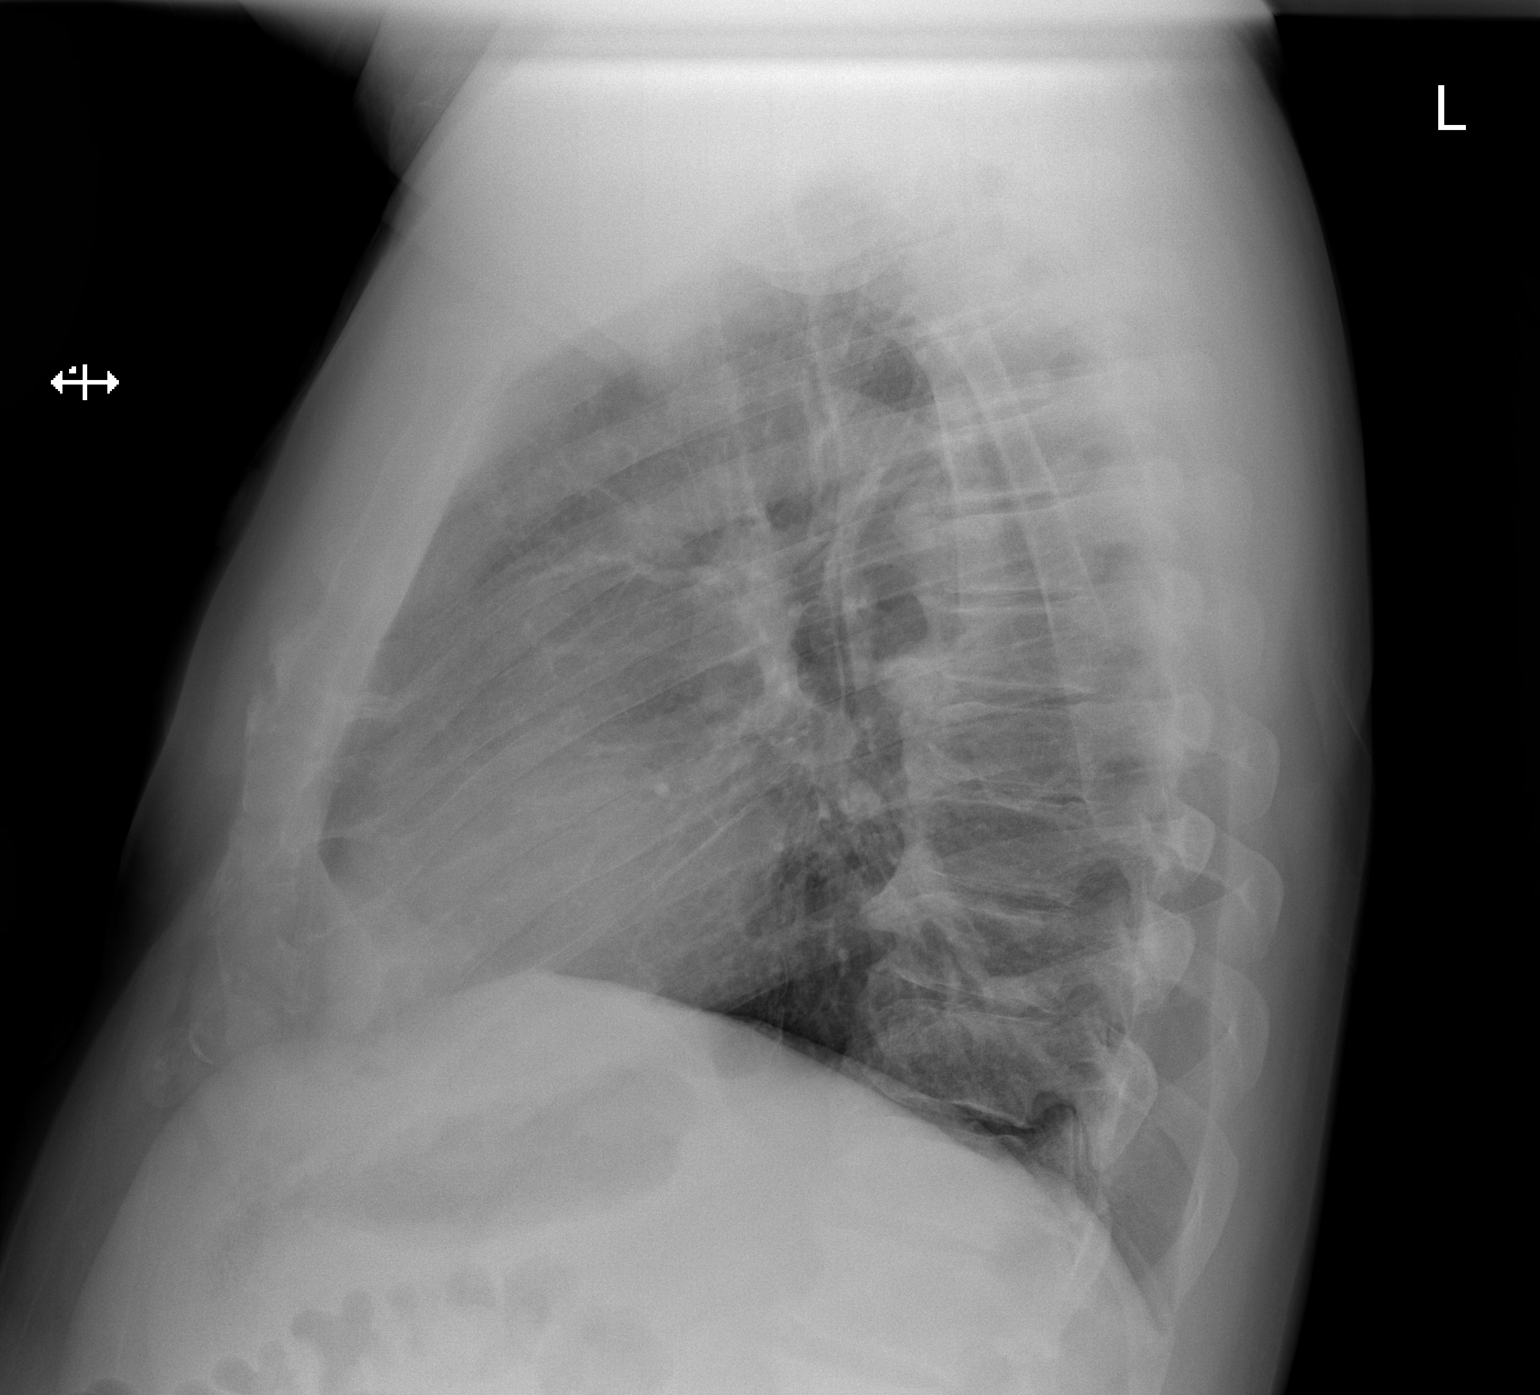

[2 of 2 positions shown; findings below may reference images not displayed]

FINDINGS: Heart and mediastinal contours are within normal limits. No focal
opacities or effusions. No acute bony abnormality. Degenerative
spurring in the thoracic spine.
IMPRESSION: No active cardiopulmonary disease.

## 2020-06-27 ENCOUNTER — Other Ambulatory Visit: Payer: Self-pay | Admitting: Registered Nurse

## 2020-06-27 DIAGNOSIS — I10 Essential (primary) hypertension: Secondary | ICD-10-CM

## 2020-06-27 NOTE — Telephone Encounter (Signed)
Requested medication (s) are due for refill today: no  Requested medication (s) are on the active medication list: yes  Last refill:  04/03/2020  Future visit scheduled: no  Notes to clinic:  overdue for a follow up appointment   Requested Prescriptions  Pending Prescriptions Disp Refills   amLODipine (NORVASC) 5 MG tablet [Pharmacy Med Name: AMLODIPINE BESYLATE 5MG  TABLETS] 90 tablet 0    Sig: TAKE 1 TABLET(5 MG) BY MOUTH DAILY      Cardiovascular:  Calcium Channel Blockers Failed - 06/27/2020  6:33 AM      Failed - Last BP in normal range    BP Readings from Last 1 Encounters:  09/09/19 (!) 151/93          Failed - Valid encounter within last 6 months    Recent Outpatient Visits           9 months ago Need for prophylactic vaccination with combined diphtheria-tetanus-pertussis (DTP) vaccine   [facility] at [facility], Richard, NP   1 year ago Acute left-sided low back pain without sciatica   [facility], Shelbie [facility]ons, NP   1 year ago Encounter to establish care   [facility], [facility], NP   2 years ago Chest pain, unspecified type   Primary Care at [facility], [place], [place]   2 years ago Sore throat   Primary Care at [facility], [facility], PA-C

## 2020-07-03 ENCOUNTER — Other Ambulatory Visit: Payer: Self-pay | Admitting: Registered Nurse

## 2020-07-03 DIAGNOSIS — E785 Hyperlipidemia, unspecified: Secondary | ICD-10-CM

## 2020-07-03 NOTE — Telephone Encounter (Signed)
Requested medication (s) are due for refill today- yes  Requested medication (s) are on the active medication list -yes  Future visit scheduled -no  Last refill: 04/03/20  Notes to clinic: Fails lab protocol (2019) -sent for review   Requested Prescriptions  Pending Prescriptions Disp Refills   atorvastatin (LIPITOR) 20 MG tablet [Pharmacy Med Name: ATORVASTATIN 20MG  TABLETS] 90 tablet 0    Sig: TAKE 1 TABLET(20 MG) BY MOUTH DAILY      Cardiovascular:  Antilipid - Statins Failed - 07/03/2020  4:08 PM      Failed - Total Cholesterol in normal range and within 360 days    Cholesterol, Total  Date Value Ref Range Status  10/20/2017 162 100 - 199 mg/dL Final          Failed - LDL in normal range and within 360 days    LDL Calculated  Date Value Ref Range Status  10/20/2017 85 0 - 99 mg/dL Final          Failed - HDL in normal range and within 360 days    HDL  Date Value Ref Range Status  10/20/2017 44 >39 mg/dL Final          Failed - Triglycerides in normal range and within 360 days    Triglycerides  Date Value Ref Range Status  10/20/2017 164 (H) 0 - 149 mg/dL Final          Passed - Patient is not pregnant      Passed - Valid encounter within last 12 months    Recent Outpatient Visits           9 months ago Need for prophylactic vaccination with combined diphtheria-tetanus-pertussis (DTP) vaccine   Primary Care at 10/22/2017, Richard, NP   1 year ago Acute left-sided low back pain without sciatica   [facility], [facility], NP   1 year ago Encounter to establish care   [facility], [facility], NP   2 years ago Chest pain, unspecified type   Primary Care at [facility], [place], [place]   2 years ago Sore throat   Primary Care at [place], Otho Bellows, PA-C                  Requested Prescriptions  Pending Prescriptions Disp Refills   atorvastatin (LIPITOR) 20 MG tablet [Pharmacy Med Name: ATORVASTATIN 20MG  TABLETS] 90  tablet 0    Sig: TAKE 1 TABLET(20 MG) BY MOUTH DAILY      Cardiovascular:  Antilipid - Statins Failed - 07/03/2020  4:08 PM      Failed - Total Cholesterol in normal range and within 360 days    Cholesterol, Total  Date Value Ref Range Status  10/20/2017 162 100 - 199 mg/dL Final          Failed - LDL in normal range and within 360 days    LDL Calculated  Date Value Ref Range Status  10/20/2017 85 0 - 99 mg/dL Final          Failed - HDL in normal range and within 360 days    HDL  Date Value Ref Range Status  10/20/2017 44 >39 mg/dL Final          Failed - Triglycerides in normal range and within 360 days    Triglycerides  Date Value Ref Range Status  10/20/2017 164 (H) 0 - 149 mg/dL Final  Passed - Patient is not pregnant      Passed - Valid encounter within last 12 months    Recent Outpatient Visits           9 months ago Need for prophylactic vaccination with combined diphtheria-tetanus-pertussis (DTP) vaccine   [facility], Richard, NP   1 year ago Acute left-sided low back pain without sciatica   [facility], Gerlene Burdock, NP   1 year ago Encounter to establish care   [facility], Gerlene Burdock, NP   2 years ago Chest pain, unspecified type   Primary Care at [facility], [place], [place]   2 years ago Sore throat   Primary Care at [facility], Marolyn Hammock, PA-C

## 2020-08-06 ENCOUNTER — Ambulatory Visit: Payer: 59 | Admitting: Registered Nurse

## 2020-08-07 ENCOUNTER — Encounter: Payer: Self-pay | Admitting: Registered Nurse

## 2020-08-09 ENCOUNTER — Ambulatory Visit: Payer: 59 | Admitting: Family Medicine

## 2020-08-16 ENCOUNTER — Telehealth: Payer: Self-pay | Admitting: Registered Nurse

## 2020-08-16 NOTE — Telephone Encounter (Signed)
Spoke with patient / patient was wanting to schedule appt . Let patient know that he had been dismissed because of  too many no shows / patient will seek urgent care

## 2020-08-17 ENCOUNTER — Ambulatory Visit: Payer: 59 | Admitting: Family Medicine

## 2020-09-25 ENCOUNTER — Other Ambulatory Visit: Payer: Self-pay | Admitting: Registered Nurse

## 2020-09-25 DIAGNOSIS — I10 Essential (primary) hypertension: Secondary | ICD-10-CM

## 2020-09-26 ENCOUNTER — Other Ambulatory Visit: Payer: Self-pay | Admitting: Registered Nurse

## 2020-09-26 DIAGNOSIS — I10 Essential (primary) hypertension: Secondary | ICD-10-CM

## 2020-10-15 ENCOUNTER — Other Ambulatory Visit: Payer: Self-pay

## 2020-10-15 ENCOUNTER — Ambulatory Visit
Admission: RE | Admit: 2020-10-15 | Discharge status: Absent without leave | Payer: Self-pay | Source: Ambulatory Visit | Attending: Registered Nurse | Admitting: Registered Nurse

## 2020-10-16 ENCOUNTER — Other Ambulatory Visit (HOSPITAL_BASED_OUTPATIENT_CLINIC_OR_DEPARTMENT_OTHER): Payer: Self-pay

## 2020-10-16 DIAGNOSIS — G4733 Obstructive sleep apnea (adult) (pediatric): Secondary | ICD-10-CM

## 2020-11-13 ENCOUNTER — Ambulatory Visit (HOSPITAL_BASED_OUTPATIENT_CLINIC_OR_DEPARTMENT_OTHER): Payer: Self-pay | Admitting: Internal Medicine

## 2020-12-03 ENCOUNTER — Ambulatory Visit (HOSPITAL_BASED_OUTPATIENT_CLINIC_OR_DEPARTMENT_OTHER): Payer: Self-pay | Admitting: Internal Medicine

## 2020-12-03 ENCOUNTER — Other Ambulatory Visit: Payer: Self-pay

## 2020-12-03 DIAGNOSIS — G4733 Obstructive sleep apnea (adult) (pediatric): Secondary | ICD-10-CM

## 2020-12-05 ENCOUNTER — Other Ambulatory Visit: Payer: Self-pay

## 2020-12-05 ENCOUNTER — Ambulatory Visit (HOSPITAL_BASED_OUTPATIENT_CLINIC_OR_DEPARTMENT_OTHER): Payer: Self-pay | Attending: Physician Assistant | Admitting: Internal Medicine

## 2020-12-05 VITALS — Ht 70.0 in | Wt 255.0 lb

## 2020-12-05 DIAGNOSIS — G4733 Obstructive sleep apnea (adult) (pediatric): Secondary | ICD-10-CM

## 2021-01-28 ENCOUNTER — Emergency Department (HOSPITAL_COMMUNITY): Payer: Self-pay

## 2021-01-28 ENCOUNTER — Emergency Department (HOSPITAL_COMMUNITY)
Admission: EM | Admit: 2021-01-28 | Discharge: 2021-01-28 | Discharge status: Against Medical Advice | Payer: Self-pay | Attending: Emergency Medicine | Admitting: Emergency Medicine

## 2021-01-28 ENCOUNTER — Other Ambulatory Visit: Payer: Self-pay

## 2021-01-28 ENCOUNTER — Encounter (HOSPITAL_COMMUNITY): Payer: Self-pay | Admitting: *Deleted

## 2021-01-28 DIAGNOSIS — Z5321 Procedure and treatment not carried out due to patient leaving prior to being seen by health care provider: Secondary | ICD-10-CM | POA: Insufficient documentation

## 2021-01-28 DIAGNOSIS — R079 Chest pain, unspecified: Secondary | ICD-10-CM | POA: Insufficient documentation

## 2021-01-28 DIAGNOSIS — Z28310 Unvaccinated for covid-19: Secondary | ICD-10-CM | POA: Insufficient documentation

## 2021-01-28 DIAGNOSIS — R0602 Shortness of breath: Secondary | ICD-10-CM | POA: Insufficient documentation

## 2021-01-28 LAB — BASIC METABOLIC PANEL
Anion gap: 9 (ref 5–15)
BUN: 13 mg/dL (ref 6–20)
CO2: 25 mmol/L (ref 22–32)
Calcium: 9.1 mg/dL (ref 8.9–10.3)
Chloride: 103 mmol/L (ref 98–111)
Creatinine, Ser: 0.79 mg/dL (ref 0.61–1.24)
GFR, Estimated: >60 mL/min (ref >60)
Glucose, Bld: 88 mg/dL (ref 70–99)
Potassium: 3.9 mmol/L (ref 3.5–5.1)
Sodium: 137 mmol/L (ref 135–145)

## 2021-01-28 LAB — CBC
HCT: 39.5 % (ref 39.0–52.0)
Hemoglobin: 13.1 g/dL (ref 13.0–17.0)
MCH: 31 pg (ref 26.0–34.0)
MCHC: 33.2 g/dL (ref 30.0–36.0)
MCV: 93.4 fL (ref 80.0–100.0)
Platelets: 267 10*3/uL (ref 150–400)
RBC: 4.23 MIL/uL (ref 4.22–5.81)
RDW: 12.9 % (ref 11.5–15.5)
WBC: 6 10*3/uL (ref 4.0–10.5)
nRBC: 0 % (ref 0.0–0.2)

## 2021-01-28 LAB — TROPONIN I (HIGH SENSITIVITY): Troponin I (High Sensitivity): 4 ng/L (ref <18)

## 2021-01-28 NOTE — ED Notes (Signed)
Pt called for room no answer

## 2021-01-28 NOTE — ED Triage Notes (Signed)
The pt was exposed to someone who had covid 3 days ago  Kissing that person  Today he is having some chest pain and shortness of breath  He has not had the covid vaccine

## 2021-01-28 NOTE — ED Notes (Signed)
Called Pt no answer still.

## 2023-03-02 IMAGING — DX DG CHEST 2V
2 series · 2 of 2 positions shown · non-contrast
Comparison: 05/10/2018

CLINICAL DATA: Chest pain and shortness of breath for 3 days

EXAM:
CHEST - 2 VIEW

[w chest pa]
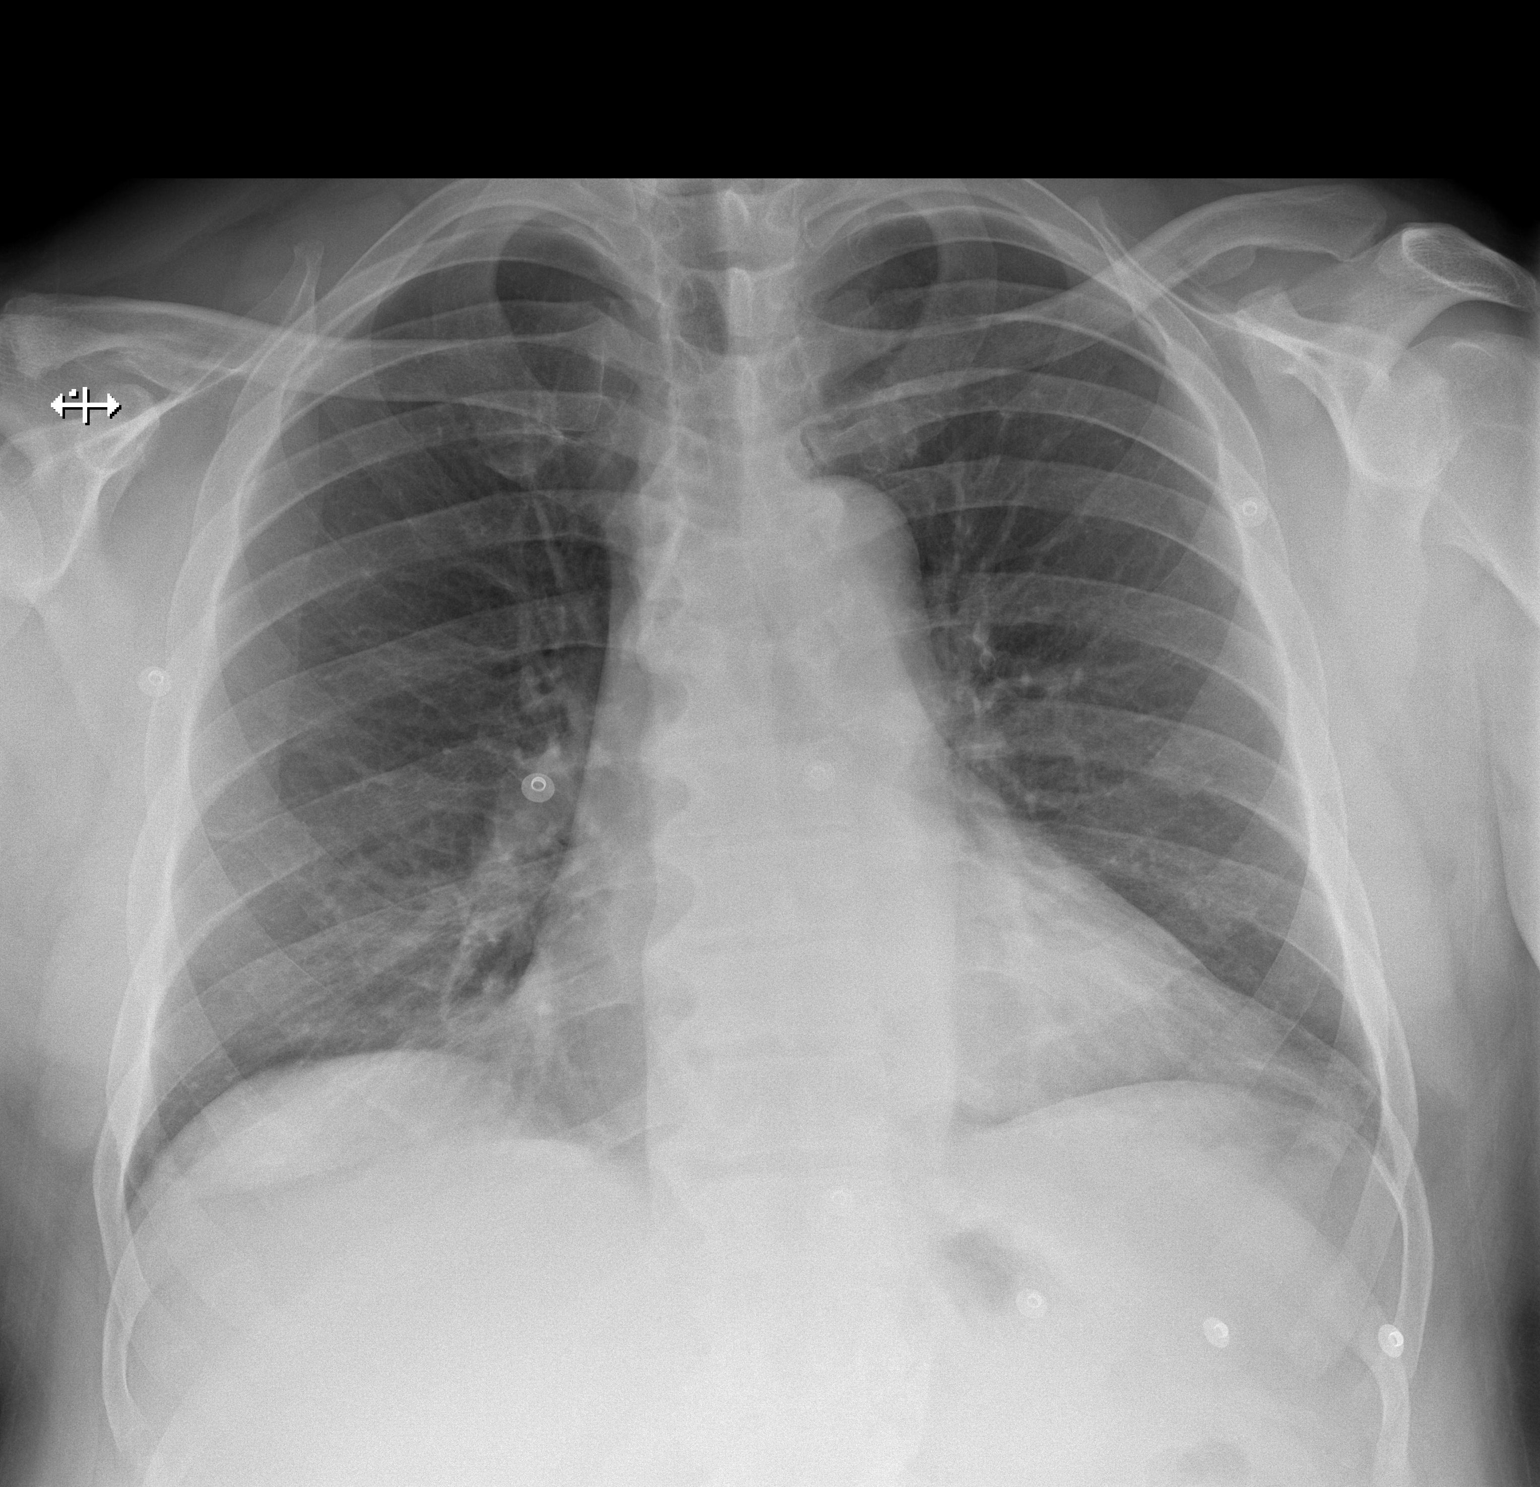

[w chest lat]
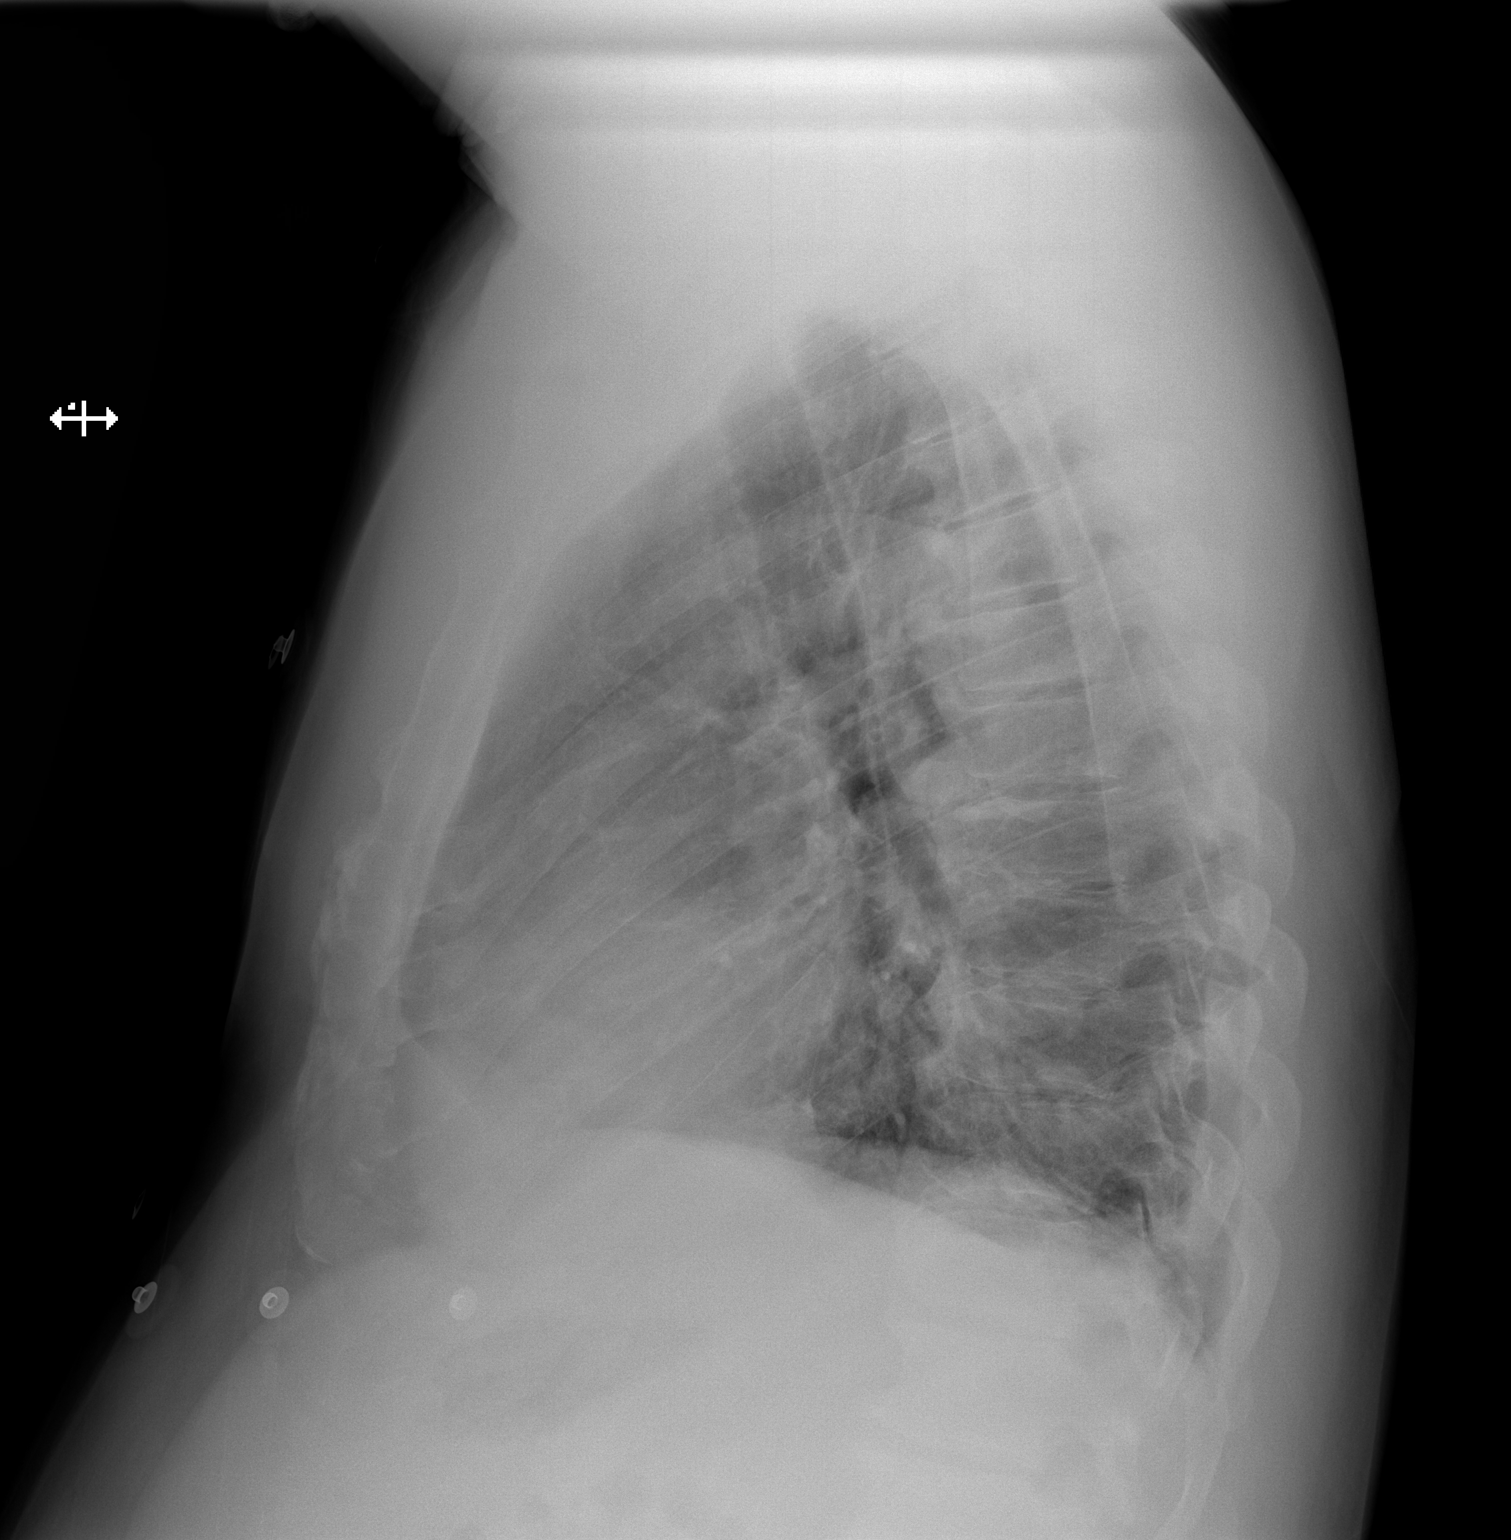

[2 of 2 positions shown; findings below may reference images not displayed]

FINDINGS: The heart size and mediastinal contours are within normal limits.
Both lungs are clear. The visualized skeletal structures are
unremarkable.
IMPRESSION: No active cardiopulmonary disease.

## 2023-04-14 ENCOUNTER — Telehealth (INDEPENDENT_AMBULATORY_CARE_PROVIDER_SITE_OTHER): Payer: Self-pay | Admitting: Primary Care

## 2023-04-14 NOTE — Telephone Encounter (Signed)
Called but pt was unavailable

## 2023-04-14 NOTE — Telephone Encounter (Signed)
Pt was unavailable.

## 2023-04-15 ENCOUNTER — Ambulatory Visit (INDEPENDENT_AMBULATORY_CARE_PROVIDER_SITE_OTHER): Payer: Self-pay | Admitting: Primary Care

## 2023-06-03 ENCOUNTER — Encounter: Payer: Self-pay | Admitting: Physician Assistant

## 2023-06-03 NOTE — Progress Notes (Signed)
Error

## 2023-08-10 ENCOUNTER — Ambulatory Visit: Payer: Self-pay | Admitting: Physician Assistant

## 2023-08-10 ENCOUNTER — Encounter: Payer: Self-pay | Admitting: Physician Assistant

## 2023-08-10 VITALS — BP 150/93 | HR 80 | Ht 70.0 in | Wt 256.0 lb

## 2023-08-10 DIAGNOSIS — R600 Localized edema: Secondary | ICD-10-CM

## 2023-08-10 DIAGNOSIS — M79672 Pain in left foot: Secondary | ICD-10-CM

## 2023-08-10 DIAGNOSIS — K625 Hemorrhage of anus and rectum: Secondary | ICD-10-CM

## 2023-08-10 DIAGNOSIS — Z5902 Unsheltered homelessness: Secondary | ICD-10-CM

## 2023-08-10 DIAGNOSIS — I10 Essential (primary) hypertension: Secondary | ICD-10-CM

## 2023-08-10 MED ORDER — ACETAMINOPHEN 325 MG PO TABS
975.0000 mg | ORAL_TABLET | Freq: Once | ORAL | Status: AC
  Administered 2023-08-10: 975 mg via ORAL

## 2023-08-10 NOTE — Progress Notes (Unsigned)
New Patient Office Visit  Subjective    Patient ID: Scott Yang, male    DOB: [birth year]  Age: 57 y.o. MRN: [id number]  CC:  Chief Complaint  Patient presents with   Foot Injury    Left foot injury 4-5 days ago. Pain is worsening unable to apply pressure on foot, denies any open sores     HPI Scott Yang states that [place] 4 or 5 [place], states that he accidentally slipped off and struck the bottom of his left foot on a piece of rebar.  States that all of his weight went on his foot.  States that he has had some swelling in his foot, states it is painful to walk on.  States that it has not had much improvement.  States that he has been out of his blood pressure medication for the past week.  States that he has bilateral lower edema that is well-controlled when he is taking his medication.  States that he gets his medications from the health department due to financial constraints and has follow-up with them on Thursday.  States that he is currently experiencing homelessness, states that he is only able to sleep sitting up so he is not able to keep his feet elevated.    Denies shortness of breath.  States he drinks very little water during the day.  Also complains of an episode of bright red blood on the tissue after having a bowel movement yesterday.  Denies rectal pain, denies straining to go.  Denies history of hemorrhoids.  Has not tried anything for relief.   Outpatient Encounter Medications as of 08/10/2023  Medication Sig   atorvastatin (LIPITOR) 20 MG tablet TAKE 1 TABLET(20 MG) BY MOUTH DAILY   chlorthalidone (HYGROTON) 25 MG tablet TAKE 1 TABLET(25 MG) BY MOUTH DAILY   meloxicam (MOBIC) 7.5 MG tablet Take 1 tablet (7.5 mg total) by mouth daily.   Multiple Vitamins-Minerals (MULTIVITAMIN WITH MINERALS) tablet Take 1 tablet by mouth daily.   Probiotic Product (PROBIOTIC-10 PO) Take by mouth.   TURMERIC PO Take by mouth.    amLODipine (NORVASC) 5 MG tablet TAKE 1 TABLET(5 MG) BY MOUTH DAILY (Patient not taking: Reported on 08/10/2023)   cyclobenzaprine (FLEXERIL) 5 MG tablet Take 1 tablet (5 mg total) by mouth 3 (three) times daily as needed for muscle spasms (May be sedating, take after work.). (Patient not taking: Reported on 08/10/2023)   vitamin B-12 (CYANOCOBALAMIN) 100 MCG tablet Take 100 mcg by mouth daily. (Patient not taking: Reported on 08/10/2023)   [EXPIRED] acetaminophen (TYLENOL) tablet 975 mg    No facility-administered encounter medications on file as of 08/10/2023.    Past Medical History:  Diagnosis Date   Hypertension     History reviewed. No pertinent surgical history.  Family History  Problem Relation Age of Onset   Hypertension Mother    Heart disease Mother 56       Pacemaker   Hypertension Father    Diabetes type II Sister     Social History   Socioeconomic History   Marital status: Married    Spouse name: Not on file   Number of children: 5   Years of education: Not on file   Highest education level: Not on file  Occupational History   Not on file  Tobacco Use   Smoking status: Former    Current packs/day: 1.00    Average packs/day: 1 pack/day for 20.0 years (  20.0 ttl pk-yrs)    Types: Cigarettes   Smokeless tobacco: Never   Tobacco comments:    Down to 3-5 cigarettes when drinking  Vaping Use   Vaping status: Never Used  Substance and Sexual Activity   Alcohol use: Yes    Alcohol/week: 3.0 standard drinks of alcohol    Types: 3 Cans of beer per week   Drug use: No   Sexual activity: Yes    Partners: Female    Birth control/protection: Post-menopausal  Other Topics Concern   Not on file  Social History Narrative   Lives with wife and step daughter.    Social Determinants of Health   Financial Resource Strain: Low Risk  (01/04/2019)   Overall Financial Resource Strain (CARDIA)    Difficulty of Paying Living Expenses: Not hard at all  Food Insecurity: No  Food Insecurity (01/04/2019)   Hunger Vital Sign    Worried About Running Out of Food in the Last Year: Never true    Ran Out of Food in the Last Year: Never true  Transportation Needs: No Transportation Needs (01/04/2019)   PRAPARE - Administrator, Civil Service (Medical): No    Lack of Transportation (Non-Medical): No  Physical Activity: Sufficiently Active (01/04/2019)   Exercise Vital Sign    Days of Exercise per Week: 5 days    Minutes of Exercise per Session: 40 min  Stress: No Stress Concern Present (01/04/2019)   [employer] of Occupational Health - Occupational Stress Questionnaire    Feeling of Stress : Only a little  Social Connections: Moderately Isolated (01/04/2019)   Social Connection and Isolation Panel [NHANES]    Frequency of Communication with Friends and Family: Never    Frequency of Social Gatherings with Friends and Family: Never    Attends Religious Services: Never    Database administrator or Organizations: No    Attends Banker Meetings: Never    Marital Status: Married  Catering manager Violence: Not At Risk (01/04/2019)   Humiliation, Afraid, Rape, and Kick questionnaire    Fear of Current or Ex-Partner: No    Emotionally Abused: No    Physically Abused: No    Sexually Abused: No    Review of Systems  Constitutional: Negative.   HENT: Negative.    Eyes: Negative.   Respiratory:  Negative for shortness of breath.   Cardiovascular:  Negative for chest pain.  Gastrointestinal:  Negative for abdominal pain, constipation, diarrhea, nausea and vomiting.  Genitourinary: Negative.   Musculoskeletal:  Positive for myalgias.  Skin: Negative.   Neurological: Negative.   Endo/Heme/Allergies: Negative.   Psychiatric/Behavioral: Negative.          Objective    BP (!) 150/93 (BP Location: Left Arm, Patient Position: Sitting, Cuff Size: Large)   Pulse 80   Ht 5\' 10"  (1.778 m)   Wt 256 lb (116.1 kg)   SpO2 94%   BMI 36.73 kg/m    Physical Exam Vitals and nursing note reviewed.  Constitutional:      Appearance: Normal appearance.  HENT:     Head: Normocephalic and atraumatic.     Right Ear: External ear normal.     Left Ear: External ear normal.     Nose: Nose normal.     Mouth/Throat:     Mouth: Mucous membranes are moist.     Pharynx: Oropharynx is clear.  Eyes:     Extraocular Movements: Extraocular movements intact.     Conjunctiva/sclera: Conjunctivae  normal.     Pupils: Pupils are equal, round, and reactive to light.  Cardiovascular:     Rate and Rhythm: Normal rate and regular rhythm.     Pulses:          Dorsalis pedis pulses are 1+ on the right side and 1+ on the left side.       Posterior tibial pulses are 1+ on the right side and 1+ on the left side.     Heart sounds: Normal heart sounds.  Pulmonary:     Effort: Pulmonary effort is normal.     Breath sounds: Normal breath sounds.  Musculoskeletal:        General: Normal range of motion.     Cervical back: Normal range of motion and neck supple.     Right lower leg: 2+ Edema present.     Left lower leg: 2+ Edema present.       Feet:  Skin:    General: Skin is warm and dry.  Neurological:     General: No focal deficit present.     Mental Status: He is alert and oriented to person, place, and time.  Psychiatric:        Mood and Affect: Mood normal.        Behavior: Behavior normal.        Thought Content: Thought content normal.        Judgment: Judgment normal.        Assessment & Plan:   Problem List Items Addressed This Visit       Cardiovascular and Mediastinum   Essential hypertension   Other Visit Diagnoses     Left foot pain    -  Primary   Relevant Medications   acetaminophen (TYLENOL) tablet 975 mg (Completed)   Other Relevant Orders   DG Foot Complete Left      1. Essential hypertension Patient declines refills of blood pressure medication at this time, wants to keep his follow-up appointment at the health  department due to financial constraints and not being able to pick up medications at a different pharmacy.  Patient education given on checking blood pressure when able, keeping feet elevated.  Red flags given for prompt reevaluation.  Patient encouraged to return to mobile unit at any time.  2. Left foot pain Patient given Tylenol in clinic.  Patient education given on supportive care - DG Foot Complete Left; Future - acetaminophen (TYLENOL) tablet 975 mg  3. Bilateral lower extremity edema Patient encouraged to keep feet elevated when able, resume blood pressure medication as soon as possible, increase water intake  4. Bright red blood per rectum Patient education given on supportive care  5. Unsheltered homelessness Is currently working with the [facility]   I have reviewed the patient's medical history (PMH, PSH, Social History, Family History, Medications, and allergies) , and have been updated if relevant. I spent 30 minutes reviewing chart and  face to face time with patient.    Return if symptoms worsen or fail to improve.   Kasandra Knudsen Mayers, PA-C

## 2023-08-10 NOTE — Patient Instructions (Signed)
You are going to get an x-ray completed of your left foot.  I do encourage you to keep your feet elevated as much as possible.  Your blood pressure is elevated today, keep your follow-up appointment with the health department on Thursday.  I do encourage you to return to the mobile unit as needed.  I encourage you to increase your water intake, you should be drinking at least 64 ounces of water a day.  Roney Jaffe, PA-C Physician Assistant [facility] [email or web address]  Foot Pain Many things can cause foot pain. Common causes include injuries to the foot. The injuries include sprains or broken bones, or injuries that affect the nerves in the feet. Other causes of foot pain include arthritis, blisters, and bunions. To know what causes your foot pain, your health care provider will take a detailed history of your symptoms. They will also do a physical exam as well as imaging tests, such as X-ray or MRI. Follow these instructions at home: Managing pain, stiffness, and swelling  If told, put ice on the painful area. Put ice in a plastic bag. Place a towel between your skin and the bag. Leave the ice on for 20 minutes, 2-3 times a day. If your skin turns bright red, remove the ice right away to prevent skin damage. The risk of damage is higher if you cannot feel pain, heat, or cold. Activity Do not stand or walk for long periods. Do stretches to relieve foot pain and stiffness as told by your provider. Do not lift anything that is heavier than 10 lb (4.5 kg), or the limit that you are told, until your provider says that it is safe. Lifting a lot of weight can put added pressure on your feet. Return to your normal activities as told by your provider. Ask your provider what activities are safe for you. Lifestyle Wear comfortable, supportive shoes that fit you well. Do not wear high heels. Keep your feet clean and dry. General  instructions Take over-the-counter and prescription medicines only as told by your provider. Rub your foot gently. Pay attention to any changes in your symptoms. Let your provider know if symptoms become worse. Keep all follow-up visits. Your provider will want to monitor your progress. Contact a health care provider if: Your pain does not get better after a few days of treatment at home. Your pain gets worse. You cannot stand on your foot. Your foot or toes are swollen. Your foot is numb or tingling. Get help right away if: Your foot or toes turn white or blue. You have warmth and redness along your foot. This information is not intended to replace advice given to you by your health care provider. Make sure you discuss any questions you have with your health care provider. Document Revised: 09/11/2022 Document Reviewed: 05/20/2022 Elsevier Patient Education  2024 [employer] Inc.  Hemorrhoids Hemorrhoids are swollen veins in and around the rectum or the opening of the butt (anus). There are two types of hemorrhoids: Internal. These occur in the veins just inside the rectum. They may poke through to the outside and become irritated and painful. External. These occur in the veins outside the anus. They can be felt as a painful swelling or hard lump near the anus. Most hemorrhoids do not cause severe problems. Often, they can be treated at home with diet and lifestyle changes. If home treatments do not help, you may need a procedure to shrink or remove the hemorrhoids. What are the  causes? Hemorrhoids are caused by pressure near the anus. This pressure may be caused by: Constipation or diarrhea. Straining to poop. Pregnancy. Obesity. Sitting or riding a bike for a long time. Heavy lifting or other things that cause you to strain. Anal sex. What are the signs or symptoms? Symptoms of this condition include: Pain. Anal itching or irritation. Bleeding from the rectum. Leakage of poop  (stool). Swelling of the anus. One or more lumps around the anus. How is this diagnosed? Hemorrhoids can often be diagnosed through a visual exam. Other exams or tests may also be done, such as: A digital rectal exam. This is when your health care provider feels inside your rectum with a gloved finger. Anoscope. This is an exam of the anus using a small tube. A blood test, if you have lost a lot of blood. A sigmoidoscopy or colonoscopy. These are tests to look inside the colon using a tube with a camera on the end. How is this treated? In most cases, hemorrhoids can be treated at home with diet and lifestyle changes. If these changes do not help, you may need to have a procedure done. These procedures can make the hemorrhoids smaller or fully remove them. Common procedures include: Rubber band ligation. Rubber bands are placed at the base of the hemorrhoids to cut off their blood supply. Sclerotherapy. Medicine is put into the hemorrhoids to shrink them. Infrared coagulation. A type of light energy is used to get rid of the hemorrhoids. Hemorrhoidectomy surgery. The hemorrhoids are removed during surgery. Then, the veins that supply them are tied off. Stapled hemorrhoidopexy surgery. The base of the hemorrhoid is stapled to the wall of the rectum. Follow these instructions at home: Medicines Take over-the-counter and prescription medicines only as told by your provider. Use medicated creams or medicines that are put in the rectum (suppositories) as told by your provider. Eating and drinking  Eat foods that are high in fiber, such as beans, whole grains, and fresh fruits and vegetables. Ask your provider about taking products that have fiber added to them (fiber supplements). Reduce the amount of fat in your diet. You can do this by eating low-fat dairy products, eating less red meat, and avoiding processed foods. Drink enough fluid to keep your pee (urine) pale yellow. Managing pain and  swelling  Take warm sitz baths for 20 minutes, 3-4 times a day. This can help ease pain and discomfort. You may do this in a bathtub or you can use a portable sitz bath that fits over the toilet. If told, put ice on the affected area. It may help to use ice packs between sitz baths. Put ice in a plastic bag. Place a towel between your skin and the bag. Leave the ice on for 20 minutes, 2-3 times a day. If your skin turns bright red, remove the ice right away to prevent skin damage. The risk of damage is higher if you cannot feel pain, heat, or cold. General instructions Exercise. Ask your provider how much and what kind of exercise is best for you. In general, you should do moderate exercise for at least 30 minutes on most days of the week (150 minutes each week). You may want to try walking, biking, or yoga. Go to the bathroom when you have the urge to poop. Do not wait. Avoid straining to poop. Keep the anus dry and clean. Use wet toilet paper or moist towelettes after you poop. Do not sit on the toilet for a  long time. This can increase blood pooling and pain. Where to find more information [employer] of Diabetes and Digestive and Kidney Diseases: [employer] Contact a health care provider if: You have more pain and swelling that do not get better with treatment. You have trouble pooping or you are not able to poop. You have pain or inflammation outside the area of the hemorrhoids. Get help right away if: You are bleeding from your rectum and you cannot get it to stop. This information is not intended to replace advice given to you by your health care provider. Make sure you discuss any questions you have with your health care provider. Document Revised: 04/30/2022 Document Reviewed: 04/30/2022 Elsevier Patient Education  2024 [employer].

## 2023-08-11 ENCOUNTER — Encounter: Payer: Self-pay | Admitting: Physician Assistant

## 2023-08-31 NOTE — Congregational Nurse Program (Signed)
  Dept: [phone]   Congregational Nurse Program Note  Date of Encounter: 08/31/2023  Past Medical History: Past Medical History:  Diagnosis Date   Hypertension     Encounter Details:  Community Questionnaire - 08/31/23 1027       Questionnaire   Ask client: Do you give verbal consent for me to treat you today? Yes    Student Assistance N/A    Location Patient Served  [facility]    Encounter Setting CN site    Population Status Unknown    Insurance Unknown    Insurance/Financial Assistance Referral N/A    Medication Have Medication Insecurities;Patient Medications Reviewed;Provided Medication Assistance    Medical Provider Yes    Screening Referrals Made N/A    Medical Referrals Made [facility] PCP/Clinic    Medical Appointment Completed N/A    CNP Interventions Advocate/Support;Navigate Healthcare System;Counsel;Educate;Spiritual Care    Screenings CN Performed Blood Pressure    ED Visit Averted N/A    Life-Saving Intervention Made N/A             Client came in for BP check today. States he only has one dose of Amlodipine left. Medications reviewed MAR states no refills and that he is taking medication daily. States Lavinia Sharps as PCP. Made plans with client to visit clinic tomorrow to get meds refilled and be seen by provider.

## 2023-09-03 ENCOUNTER — Encounter: Payer: Self-pay | Admitting: Critical Care Medicine

## 2023-09-03 ENCOUNTER — Other Ambulatory Visit (HOSPITAL_COMMUNITY): Payer: Self-pay

## 2023-09-03 ENCOUNTER — Other Ambulatory Visit: Payer: Self-pay | Admitting: Critical Care Medicine

## 2023-09-03 ENCOUNTER — Encounter: Payer: Self-pay | Admitting: *Deleted

## 2023-09-03 MED ORDER — ONDANSETRON HCL 4 MG PO TABS
4.0000 mg | ORAL_TABLET | Freq: Three times a day (TID) | ORAL | 0 refills | Status: DC | PRN
  Filled 2023-09-03: qty 18, 21d supply, fill #0

## 2023-09-03 MED ORDER — DIPHENOXYLATE-ATROPINE 2.5-0.025 MG PO TABS
1.0000 | ORAL_TABLET | Freq: Four times a day (QID) | ORAL | 0 refills | Status: DC | PRN
  Filled 2023-09-03: qty 10, 3d supply, fill #0

## 2023-09-03 NOTE — Congregational Nurse Program (Signed)
  Dept: [phone]   Congregational Nurse Program Note  Date of Encounter: 09/03/2023  Past Medical History: Past Medical History:  Diagnosis Date   Hypertension     Encounter Details:  Community Questionnaire - 09/03/23 1553       Questionnaire   Ask client: Do you give verbal consent for me to treat you today? Yes    Student Assistance N/A    Location Patient Served  [facility]    Encounter Setting CN site    Population Status Unhoused    Insurance Unknown    Insurance/Financial Assistance Referral N/A    Medication N/A    Medical Provider Yes    Screening Referrals Made N/A    Medical Referrals Made N/A    Medical Appointment Completed [facility]    CNP Interventions Advocate/Support;Case Management    Screenings CN Performed N/A    ED Visit Averted N/A    Life-Saving Intervention Made N/A            Client at [facility] c/o nausea and diarrhea. Client given IV liquid per MD request and MD coming to [facility] to evaluate client. Client is staying in chapel away from other residents at this time. Teanna Elem W RN CN

## 2023-09-03 NOTE — Progress Notes (Signed)
 Meds for norovirus

## 2023-09-04 NOTE — Progress Notes (Signed)
 This patient was seen in the [facility] clinic today there has been norovirus outbreak at the shelter and he began having symptoms of nausea and vomiting and profuse diarrhea without blood in the stool for the past 24 hours.  History of hypertension blood pressure recently checked slightly elevated.  He is on amlodipine  10 mg daily atorvastatin  daily.  Primary care provider has been at [facility] with nurse practitioner Placey the patient now has insurance with [employer] off insurance exchange  On exam patient's abdomen is slightly tender otherwise unremarkable skin turgor is good  Impression is that of likely norovirus infection patient will be isolated in a motel for 48 hours and he will receive as needed Zofran  and Lomotil  prescription sent to outpatient pharmacy will be obtained and given to the patient

## 2023-09-08 ENCOUNTER — Other Ambulatory Visit (HOSPITAL_COMMUNITY): Payer: Self-pay

## 2023-09-08 NOTE — Congregational Nurse Program (Signed)
 Client came in to discuss health and spiritual wellness. Client states that he contracted Norovirus from shelter and had been staying in an Extended Stay for a few days. He states this time allowed him to quit smoking cigarettes (3-4x a day), drinking alcohol. He says he's been 3 days sober and feels great and doesn't want to go back to previous lifestyle. Provided resources for client to prevent relapse. Client prefers spiritual care for prevention at this time. RN educated client and client in agreement.

## 2024-03-29 NOTE — Procedures (Signed)
 SABRA

## 2024-03-29 NOTE — Procedures (Signed)
 Scott Yang

## 2024-04-22 ENCOUNTER — Ambulatory Visit (HOSPITAL_BASED_OUTPATIENT_CLINIC_OR_DEPARTMENT_OTHER): Admitting: Family Medicine

## 2024-08-03 NOTE — Progress Notes (Signed)
 Patient declined SDOH

## 2024-08-26 NOTE — Progress Notes (Signed)
 The patient attended a screening event on 08/03/2024, where his blood pressure was measured at 155/98 mmHg after a second check. During the event, the patient does not have a smoking status, has insurance, no PCP indicated, and the pt declined the [facility] screener.  A chart review indicates Ronal Jenkins Houseman, NP - [facility] as his PCP. The pt does not have any [facility] visible appts with his PCP in the last 12 months and ever. The pt has [facility] and Medicaid for his insurance. Chart review further indicates that he is a former smoker and has no SDOH needs indicated at this time.  Call Attempt #1: CHW called pt at mobile # to discuss PCP status. Vm was left for pt to return call. CHW tried home # and the pt was not able to be reached at this time due to the call not being complete and vm was unable to be left.   Call Attempt #2: CHW called pt at both numbers again to discuss PCP status. The pt was not able to be reached at this time due to the call not being complete and vm was unable to be left. Vm was left again on the mobile #.   Call Attempt #3: CHW called pt at both numbers for third attempt. Vm was left on the mobile # again andn ot able to leave a vm on the home #. CHW was unable to reach pt at this time for f/u.   Incoming Call: Caller shared his name is [facility] and he said this was his number not the number for the pt.   CHW sent letter with [facility], [facility] PCP resource flyers, bp education, and healthy cooking class in case needed by the pt.   An additional follow up will be done at a later date per the [employer] protocol.

## 2024-09-06 ENCOUNTER — Other Ambulatory Visit: Payer: Self-pay | Admitting: *Deleted

## 2024-09-06 DIAGNOSIS — I10 Essential (primary) hypertension: Secondary | ICD-10-CM

## 2024-09-06 DIAGNOSIS — E7800 Pure hypercholesterolemia, unspecified: Secondary | ICD-10-CM

## 2024-09-06 DIAGNOSIS — M545 Low back pain, unspecified: Secondary | ICD-10-CM

## 2024-09-06 DIAGNOSIS — E785 Hyperlipidemia, unspecified: Secondary | ICD-10-CM

## 2024-09-06 DIAGNOSIS — R7303 Prediabetes: Secondary | ICD-10-CM

## 2024-09-06 MED ORDER — CYCLOBENZAPRINE HCL 10 MG PO TABS: 10.0000 mg | ORAL_TABLET | Freq: Every day | ORAL | 0 refills | Status: AC

## 2024-09-06 MED ORDER — ATORVASTATIN CALCIUM 10 MG PO TABS: 10.0000 mg | ORAL_TABLET | Freq: Every day | ORAL | 0 refills | Status: AC

## 2024-09-06 MED ORDER — AMLODIPINE BESYLATE 10 MG PO TABS: 10.0000 mg | ORAL_TABLET | Freq: Every day | ORAL | 0 refills | Status: AC

## 2024-09-06 MED ORDER — IBUPROFEN 800 MG PO TABS: 800.0000 mg | ORAL_TABLET | Freq: Three times a day (TID) | ORAL | 0 refills | Status: AC | PRN

## 2024-09-06 NOTE — Progress Notes (Signed)
 He came by for monthly Rfs.  He is having some pain in his right heel that may be PF.  He also has diffuse body aches and is on a statin.  He will hold statin to see if symptoms resolve. He wants BLL.  Will go to [facility] for LO visit 1/12 at 2p.  SP teamed.  Scripts sent to the pharmacy of his choice.  ([facility])

## 2024-09-12 ENCOUNTER — Telehealth: Payer: Self-pay

## 2024-09-12 ENCOUNTER — Other Ambulatory Visit: Admitting: Nurse Practitioner

## 2024-09-12 NOTE — Telephone Encounter (Signed)
 Att to contact pt to confirm if coming to lab appt

## 2024-09-13 ENCOUNTER — Other Ambulatory Visit: Admitting: Nurse Practitioner

## 2024-09-22 ENCOUNTER — Telehealth: Admitting: Nurse Practitioner

## 2024-09-22 VITALS — BP 122/73 | Resp 16 | Wt 169.0 lb

## 2024-09-22 NOTE — Progress Notes (Signed)
 Labs completed right arm

## 2024-09-22 NOTE — Progress Notes (Signed)
 Labs to be forwarded to PCP at [facility]

## 2024-09-23 ENCOUNTER — Ambulatory Visit: Payer: Self-pay | Admitting: *Deleted

## 2024-09-23 LAB — COMPREHENSIVE METABOLIC PANEL WITH GFR
ALT: 28 IU/L (ref 0–44)
AST: 23 IU/L (ref 0–40)
Albumin: 4.4 g/dL (ref 3.8–4.9)
Alkaline Phosphatase: 102 IU/L (ref 47–123)
BUN/Creatinine Ratio: 20 (ref 9–20)
BUN: 17 mg/dL (ref 6–24)
Bilirubin Total: 0.3 mg/dL (ref 0.0–1.2)
CO2: 24 mmol/L (ref 20–29)
Calcium: 9.2 mg/dL (ref 8.7–10.2)
Chloride: 104 mmol/L (ref 96–106)
Creatinine, Ser: 0.87 mg/dL (ref 0.76–1.27)
Globulin, Total: 2.8 g/dL (ref 1.5–4.5)
Glucose: 86 mg/dL (ref 70–99)
Potassium: 4.6 mmol/L (ref 3.5–5.2)
Sodium: 143 mmol/L (ref 134–144)
Total Protein: 7.2 g/dL (ref 6.0–8.5)
eGFR: 100 mL/min/1.73

## 2024-09-23 LAB — CBC WITH DIFFERENTIAL/PLATELET
Basophils Absolute: 0 x10E3/uL (ref 0.0–0.2)
Basos: 1 %
EOS (ABSOLUTE): 0.2 x10E3/uL (ref 0.0–0.4)
Eos: 5 %
Hematocrit: 40 % (ref 37.5–51.0)
Hemoglobin: 13 g/dL (ref 13.0–17.7)
Immature Grans (Abs): 0 x10E3/uL (ref 0.0–0.1)
Immature Granulocytes: 0 %
Lymphocytes Absolute: 1.4 x10E3/uL (ref 0.7–3.1)
Lymphs: 34 %
MCH: 30.5 pg (ref 26.6–33.0)
MCHC: 32.5 g/dL (ref 31.5–35.7)
MCV: 94 fL (ref 79–97)
Monocytes Absolute: 0.6 x10E3/uL (ref 0.1–0.9)
Monocytes: 14 %
Neutrophils Absolute: 1.9 x10E3/uL (ref 1.4–7.0)
Neutrophils: 46 %
Platelets: 250 x10E3/uL (ref 150–450)
RBC: 4.26 x10E6/uL (ref 4.14–5.80)
RDW: 12.5 % (ref 11.6–15.4)
WBC: 4.1 x10E3/uL (ref 3.4–10.8)

## 2024-09-23 LAB — LIPID PANEL
Chol/HDL Ratio: 3.8 ratio (ref 0.0–5.0)
Cholesterol, Total: 141 mg/dL (ref 100–199)
HDL: 37 mg/dL — ABNORMAL LOW
LDL Chol Calc (NIH): 66 mg/dL (ref 0–99)
Triglycerides: 232 mg/dL — ABNORMAL HIGH (ref 0–149)
VLDL Cholesterol Cal: 38 mg/dL (ref 5–40)

## 2024-09-23 LAB — HEMOGLOBIN A1C
Est. average glucose Bld gHb Est-mCnc: 120 mg/dL
Hgb A1c MFr Bld: 5.8 % — ABNORMAL HIGH (ref 4.8–5.6)

## 2024-09-23 LAB — PSA: Prostate Specific Ag, Serum: 1 ng/mL (ref 0.0–4.0)

## 2024-09-23 LAB — HEPATITIS C ANTIBODY: Hep C Virus Ab: NONREACTIVE

## 2024-09-23 LAB — HIV ANTIBODY (ROUTINE TESTING W REFLEX): HIV Screen 4th Generation wRfx: NONREACTIVE
